# Patient Record
Sex: Female | Born: 1945 | Race: Black or African American | Hispanic: No | Marital: Married | Smoking: Never smoker
Health system: Southern US, Community
[De-identification: ages and names within clinical notes are randomized; demographics above are authoritative.]

## PROBLEM LIST (undated history)

## (undated) DIAGNOSIS — N943 Premenstrual tension syndrome: Secondary | ICD-10-CM

## (undated) DIAGNOSIS — R198 Other specified symptoms and signs involving the digestive system and abdomen: Secondary | ICD-10-CM

## (undated) DIAGNOSIS — L659 Nonscarring hair loss, unspecified: Secondary | ICD-10-CM

## (undated) DIAGNOSIS — H579 Unspecified disorder of eye and adnexa: Secondary | ICD-10-CM

## (undated) DIAGNOSIS — I10 Essential (primary) hypertension: Secondary | ICD-10-CM

## (undated) HISTORY — DX: Unspecified disorder of eye and adnexa: H57.9

## (undated) HISTORY — PX: OTHER SURGICAL HISTORY: SHX169

## (undated) HISTORY — DX: Premenstrual tension syndrome: N94.3

## (undated) HISTORY — DX: Essential (primary) hypertension: I10

## (undated) HISTORY — DX: Other specified symptoms and signs involving the digestive system and abdomen: R19.8

## (undated) HISTORY — DX: Nonscarring hair loss, unspecified: L65.9

---

## 1983-10-23 HISTORY — PX: TUBAL LIGATION: SHX77

## 1998-01-19 ENCOUNTER — Ambulatory Visit (HOSPITAL_COMMUNITY): Admission: RE | Admit: 1998-01-19 | Discharge: 1998-01-19 | Payer: Self-pay | Admitting: Family Medicine

## 1999-01-24 ENCOUNTER — Encounter: Payer: Self-pay | Admitting: Family Medicine

## 1999-01-24 ENCOUNTER — Ambulatory Visit (HOSPITAL_COMMUNITY): Admission: RE | Admit: 1999-01-24 | Discharge: 1999-01-24 | Payer: Self-pay | Admitting: Family Medicine

## 1999-03-01 ENCOUNTER — Other Ambulatory Visit: Admission: RE | Admit: 1999-03-01 | Discharge: 1999-03-01 | Payer: Self-pay | Admitting: Family Medicine

## 2000-02-13 ENCOUNTER — Ambulatory Visit (HOSPITAL_COMMUNITY): Admission: RE | Admit: 2000-02-13 | Discharge: 2000-02-13 | Payer: Self-pay | Admitting: Family Medicine

## 2000-02-13 ENCOUNTER — Encounter: Payer: Self-pay | Admitting: Family Medicine

## 2000-03-05 ENCOUNTER — Other Ambulatory Visit: Admission: RE | Admit: 2000-03-05 | Discharge: 2000-03-05 | Payer: Self-pay | Admitting: Family Medicine

## 2001-02-14 ENCOUNTER — Encounter: Payer: Self-pay | Admitting: Family Medicine

## 2001-02-14 ENCOUNTER — Ambulatory Visit (HOSPITAL_COMMUNITY): Admission: RE | Admit: 2001-02-14 | Discharge: 2001-02-14 | Payer: Self-pay | Admitting: Family Medicine

## 2001-03-06 ENCOUNTER — Other Ambulatory Visit: Admission: RE | Admit: 2001-03-06 | Discharge: 2001-03-06 | Payer: Self-pay | Admitting: Family Medicine

## 2001-05-19 ENCOUNTER — Other Ambulatory Visit: Admission: RE | Admit: 2001-05-19 | Discharge: 2001-05-19 | Payer: Self-pay | Admitting: Family Medicine

## 2002-03-04 ENCOUNTER — Ambulatory Visit (HOSPITAL_COMMUNITY): Admission: RE | Admit: 2002-03-04 | Discharge: 2002-03-04 | Payer: Self-pay | Admitting: Family Medicine

## 2002-03-04 ENCOUNTER — Encounter: Payer: Self-pay | Admitting: Family Medicine

## 2002-03-20 ENCOUNTER — Other Ambulatory Visit: Admission: RE | Admit: 2002-03-20 | Discharge: 2002-03-20 | Payer: Self-pay | Admitting: Family Medicine

## 2003-03-16 ENCOUNTER — Ambulatory Visit (HOSPITAL_COMMUNITY): Admission: RE | Admit: 2003-03-16 | Discharge: 2003-03-16 | Payer: Self-pay | Admitting: Family Medicine

## 2003-03-16 ENCOUNTER — Encounter: Payer: Self-pay | Admitting: Family Medicine

## 2003-04-27 ENCOUNTER — Other Ambulatory Visit: Admission: RE | Admit: 2003-04-27 | Discharge: 2003-04-27 | Payer: Self-pay | Admitting: Family Medicine

## 2004-03-21 ENCOUNTER — Ambulatory Visit (HOSPITAL_COMMUNITY): Admission: RE | Admit: 2004-03-21 | Discharge: 2004-03-21 | Payer: Self-pay | Admitting: Family Medicine

## 2004-05-15 ENCOUNTER — Other Ambulatory Visit: Admission: RE | Admit: 2004-05-15 | Discharge: 2004-05-15 | Payer: Self-pay | Admitting: Family Medicine

## 2004-08-28 ENCOUNTER — Ambulatory Visit: Payer: Self-pay | Admitting: Family Medicine

## 2004-08-28 ENCOUNTER — Encounter: Admission: RE | Admit: 2004-08-28 | Discharge: 2004-08-28 | Payer: Self-pay | Admitting: Family Medicine

## 2004-10-22 HISTORY — PX: BREAST ENHANCEMENT SURGERY: SHX7

## 2004-10-22 HISTORY — PX: ABDOMINAL SURGERY: SHX537

## 2004-10-22 HISTORY — PX: LIPOSUCTION: SHX10

## 2005-03-28 ENCOUNTER — Ambulatory Visit (HOSPITAL_COMMUNITY): Admission: RE | Admit: 2005-03-28 | Discharge: 2005-03-28 | Payer: Self-pay | Admitting: Family Medicine

## 2005-04-16 ENCOUNTER — Ambulatory Visit: Payer: Self-pay | Admitting: Family Medicine

## 2005-05-21 ENCOUNTER — Ambulatory Visit: Payer: Self-pay | Admitting: Family Medicine

## 2005-05-28 ENCOUNTER — Ambulatory Visit: Payer: Self-pay | Admitting: Family Medicine

## 2005-05-28 ENCOUNTER — Other Ambulatory Visit: Admission: RE | Admit: 2005-05-28 | Discharge: 2005-05-28 | Payer: Self-pay | Admitting: Family Medicine

## 2005-05-29 ENCOUNTER — Encounter: Admission: RE | Admit: 2005-05-29 | Discharge: 2005-05-29 | Payer: Self-pay | Admitting: Family Medicine

## 2005-06-06 ENCOUNTER — Ambulatory Visit: Payer: Self-pay | Admitting: Internal Medicine

## 2005-08-14 ENCOUNTER — Ambulatory Visit: Payer: Self-pay | Admitting: Family Medicine

## 2005-10-22 ENCOUNTER — Encounter: Payer: Self-pay | Admitting: Family Medicine

## 2005-10-22 HISTORY — PX: AUGMENTATION MAMMAPLASTY: SUR837

## 2006-06-03 ENCOUNTER — Ambulatory Visit: Payer: Self-pay | Admitting: Family Medicine

## 2006-06-10 ENCOUNTER — Ambulatory Visit: Payer: Self-pay | Admitting: Family Medicine

## 2006-06-10 ENCOUNTER — Other Ambulatory Visit: Admission: RE | Admit: 2006-06-10 | Discharge: 2006-06-10 | Payer: Self-pay | Admitting: Family Medicine

## 2006-06-10 ENCOUNTER — Encounter: Payer: Self-pay | Admitting: Family Medicine

## 2006-06-14 ENCOUNTER — Encounter: Admission: RE | Admit: 2006-06-14 | Discharge: 2006-06-14 | Payer: Self-pay | Admitting: Family Medicine

## 2006-08-07 ENCOUNTER — Ambulatory Visit: Payer: Self-pay | Admitting: Family Medicine

## 2007-05-29 ENCOUNTER — Encounter: Payer: Self-pay | Admitting: Family Medicine

## 2007-05-29 DIAGNOSIS — J309 Allergic rhinitis, unspecified: Secondary | ICD-10-CM

## 2007-05-29 DIAGNOSIS — R32 Unspecified urinary incontinence: Secondary | ICD-10-CM | POA: Insufficient documentation

## 2007-05-29 DIAGNOSIS — L659 Nonscarring hair loss, unspecified: Secondary | ICD-10-CM | POA: Insufficient documentation

## 2007-05-29 DIAGNOSIS — I10 Essential (primary) hypertension: Secondary | ICD-10-CM

## 2007-05-29 DIAGNOSIS — J45909 Unspecified asthma, uncomplicated: Secondary | ICD-10-CM

## 2007-06-12 ENCOUNTER — Ambulatory Visit: Payer: Self-pay | Admitting: Family Medicine

## 2007-06-12 LAB — CONVERTED CEMR LAB
AST: 18 units/L (ref 0–37)
Albumin: 4 g/dL (ref 3.5–5.2)
BUN: 14 mg/dL (ref 6–23)
Basophils Absolute: 0 10*3/uL (ref 0.0–0.1)
CO2: 36 meq/L — ABNORMAL HIGH (ref 19–32)
Cholesterol: 200 mg/dL (ref 0–200)
GFR calc Af Amer: 93 mL/min
Glucose, Urine, Semiquant: NEGATIVE
HCT: 39.3 % (ref 36.0–46.0)
HDL: 66.1 mg/dL (ref >39.0)
Monocytes Relative: 10.5 % (ref 3.0–11.0)
Potassium: 3.5 meq/L (ref 3.5–5.1)
Protein, U semiquant: NEGATIVE
TSH: 1.69 microintl units/mL (ref 0.35–5.50)
Total Protein: 7.1 g/dL (ref 6.0–8.3)
Triglycerides: 59 mg/dL (ref 0–149)
Urobilinogen, UA: 0.2
VLDL: 12 mg/dL (ref 0–40)

## 2007-07-29 ENCOUNTER — Ambulatory Visit: Payer: Self-pay | Admitting: Family Medicine

## 2007-07-29 ENCOUNTER — Encounter: Payer: Self-pay | Admitting: Family Medicine

## 2007-07-29 ENCOUNTER — Other Ambulatory Visit: Admission: RE | Admit: 2007-07-29 | Discharge: 2007-07-29 | Payer: Self-pay | Admitting: Family Medicine

## 2007-08-29 ENCOUNTER — Ambulatory Visit: Payer: Self-pay | Admitting: Internal Medicine

## 2007-09-12 ENCOUNTER — Ambulatory Visit: Payer: Self-pay | Admitting: Internal Medicine

## 2007-09-12 ENCOUNTER — Encounter: Payer: Self-pay | Admitting: Internal Medicine

## 2007-09-12 ENCOUNTER — Encounter: Payer: Self-pay | Admitting: Family Medicine

## 2007-11-26 ENCOUNTER — Encounter: Admission: RE | Admit: 2007-11-26 | Discharge: 2007-11-26 | Payer: Self-pay | Admitting: Family Medicine

## 2008-03-02 ENCOUNTER — Ambulatory Visit: Payer: Self-pay | Admitting: Family Medicine

## 2008-03-02 DIAGNOSIS — L259 Unspecified contact dermatitis, unspecified cause: Secondary | ICD-10-CM

## 2008-07-30 ENCOUNTER — Ambulatory Visit: Payer: Self-pay | Admitting: Family Medicine

## 2008-07-30 LAB — CONVERTED CEMR LAB
AST: 23 units/L (ref 0–37)
Basophils Absolute: 0 10*3/uL (ref 0.0–0.1)
Bilirubin Urine: NEGATIVE
Blood in Urine, dipstick: NEGATIVE
CO2: 34 meq/L — ABNORMAL HIGH (ref 19–32)
Calcium: 9.6 mg/dL (ref 8.4–10.5)
Chloride: 102 meq/L (ref 96–112)
Cholesterol: 215 mg/dL (ref 0–200)
Direct LDL: 139 mg/dL
Eosinophils Relative: 4.9 % (ref 0.0–5.0)
GFR calc Af Amer: 93 mL/min
GFR calc non Af Amer: 77 mL/min
Glucose, Urine, Semiquant: NEGATIVE
Hemoglobin: 12.8 g/dL (ref 12.0–15.0)
Ketones, urine, test strip: NEGATIVE
MCV: 90.5 fL (ref 78.0–100.0)
Neutro Abs: 1.7 10*3/uL (ref 1.4–7.7)
Potassium: 3.6 meq/L (ref 3.5–5.1)
Specific Gravity, Urine: 1.025
TSH: 1.67 microintl units/mL (ref 0.35–5.50)
Total Bilirubin: 0.7 mg/dL (ref 0.3–1.2)
VLDL: 8 mg/dL (ref 0–40)
pH: 5

## 2008-08-05 ENCOUNTER — Encounter: Payer: Self-pay | Admitting: Family Medicine

## 2008-08-06 ENCOUNTER — Other Ambulatory Visit: Admission: RE | Admit: 2008-08-06 | Discharge: 2008-08-06 | Payer: Self-pay | Admitting: Family Medicine

## 2008-08-06 ENCOUNTER — Encounter: Payer: Self-pay | Admitting: Family Medicine

## 2008-08-06 ENCOUNTER — Ambulatory Visit: Payer: Self-pay | Admitting: Family Medicine

## 2008-08-16 ENCOUNTER — Encounter: Admission: RE | Admit: 2008-08-16 | Discharge: 2008-08-16 | Payer: Self-pay | Admitting: Family Medicine

## 2008-08-19 ENCOUNTER — Ambulatory Visit: Payer: Self-pay | Admitting: Family Medicine

## 2009-02-05 ENCOUNTER — Ambulatory Visit: Payer: Self-pay | Admitting: Family Medicine

## 2009-03-14 ENCOUNTER — Ambulatory Visit: Payer: Self-pay | Admitting: Family Medicine

## 2009-03-15 DIAGNOSIS — I872 Venous insufficiency (chronic) (peripheral): Secondary | ICD-10-CM | POA: Insufficient documentation

## 2009-04-12 ENCOUNTER — Encounter: Admission: RE | Admit: 2009-04-12 | Discharge: 2009-04-12 | Payer: Self-pay | Admitting: Family Medicine

## 2009-08-08 ENCOUNTER — Ambulatory Visit: Payer: Self-pay | Admitting: Family Medicine

## 2009-08-08 ENCOUNTER — Encounter: Payer: Self-pay | Admitting: Family Medicine

## 2009-08-08 ENCOUNTER — Other Ambulatory Visit: Admission: RE | Admit: 2009-08-08 | Discharge: 2009-08-08 | Payer: Self-pay | Admitting: Family Medicine

## 2009-08-08 LAB — CONVERTED CEMR LAB
Bilirubin Urine: NEGATIVE
Glucose, Urine, Semiquant: NEGATIVE
Specific Gravity, Urine: 1.02
Urobilinogen, UA: 0.2

## 2009-08-09 LAB — CONVERTED CEMR LAB
ALT: 28 units/L (ref 0–35)
Albumin: 4.1 g/dL (ref 3.5–5.2)
BUN: 17 mg/dL (ref 6–23)
CO2: 34 meq/L — ABNORMAL HIGH (ref 19–32)
Calcium: 9.3 mg/dL (ref 8.4–10.5)
Direct LDL: 174.2 mg/dL
Eosinophils Relative: 4.3 % (ref 0.0–5.0)
GFR calc non Af Amer: 80.72 mL/min (ref >60)
Glucose, Bld: 97 mg/dL (ref 70–99)
HCT: 40.5 % (ref 36.0–46.0)
Hemoglobin: 13.3 g/dL (ref 12.0–15.0)
MCV: 92.1 fL (ref 78.0–100.0)
Monocytes Absolute: 0.4 10*3/uL (ref 0.1–1.0)
Monocytes Relative: 12.3 % — ABNORMAL HIGH (ref 3.0–12.0)
Neutro Abs: 1.6 10*3/uL (ref 1.4–7.7)
Neutrophils Relative %: 44.8 % (ref 43.0–77.0)
TSH: 2.08 microintl units/mL (ref 0.35–5.50)
Total Protein: 7.9 g/dL (ref 6.0–8.3)
WBC: 3.6 10*3/uL — ABNORMAL LOW (ref 4.5–10.5)

## 2009-10-15 IMAGING — MG MM SCREENING W/IMPLANTS
9 series · 9 of 9 positions shown · non-contrast
Comparison: none

DG SCREENING W/IMPLANTS
Bilateral CC and MLO view(s) were taken.

DIGITAL SCREENING MAMMOGRAM W/IMPLANTS WITH CAD:
Implants are present in a subpectoral location.  Standard and modified compression views are 
obtained.
There are scattered fibroglandular densities.  No dominant masses or malignant type calcifications 
are identified.  Compared with prior studies.

[R CC]
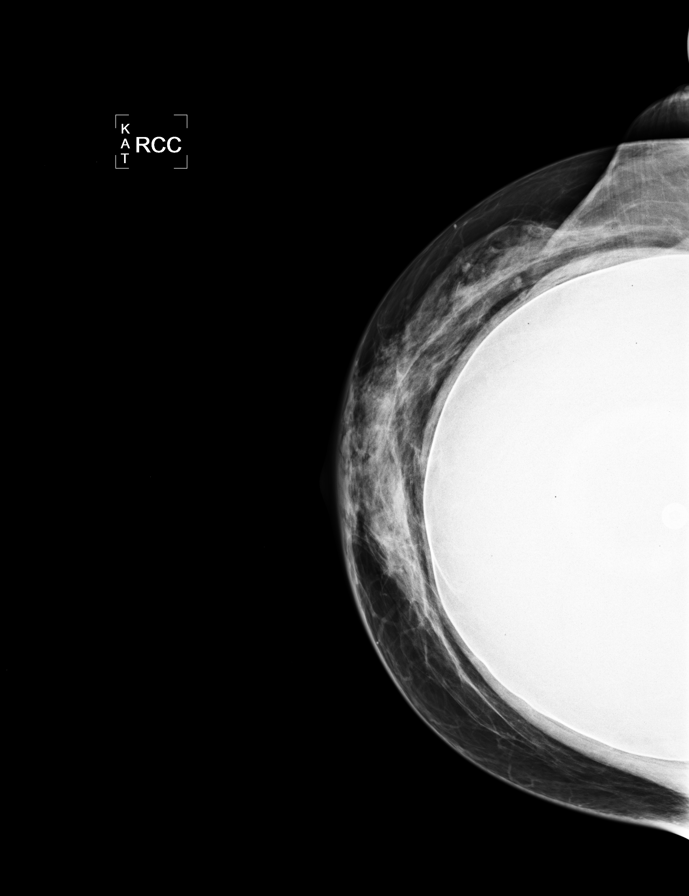

[L CC]
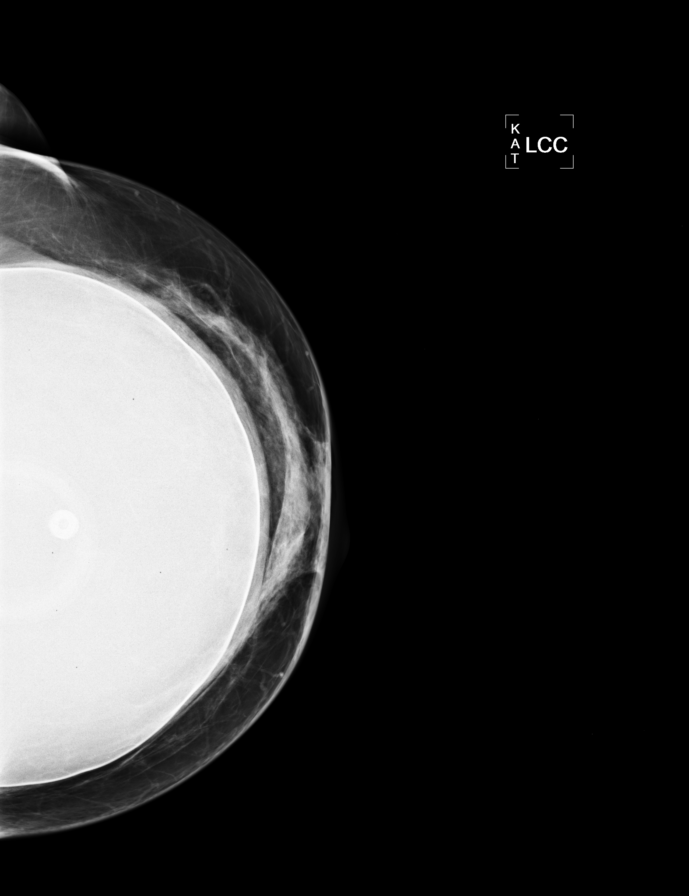

[L MLO]
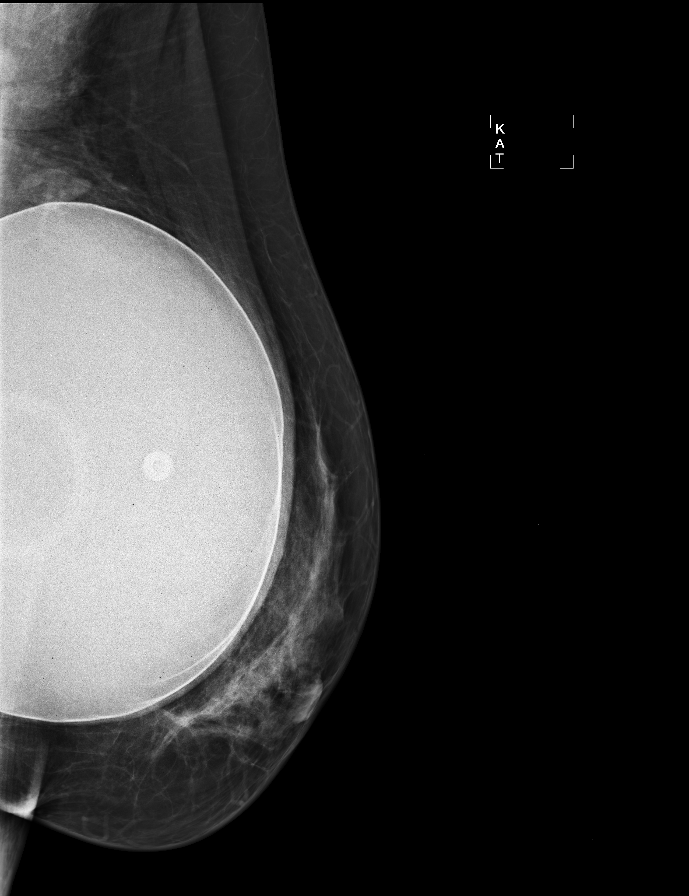

[R MLO]
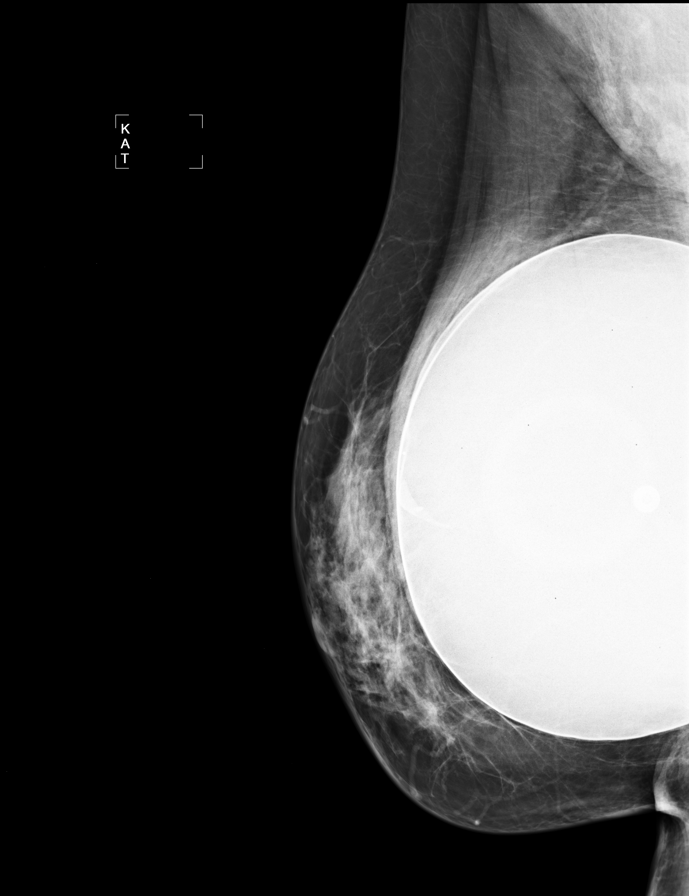

[R CCID (1 of 2)]
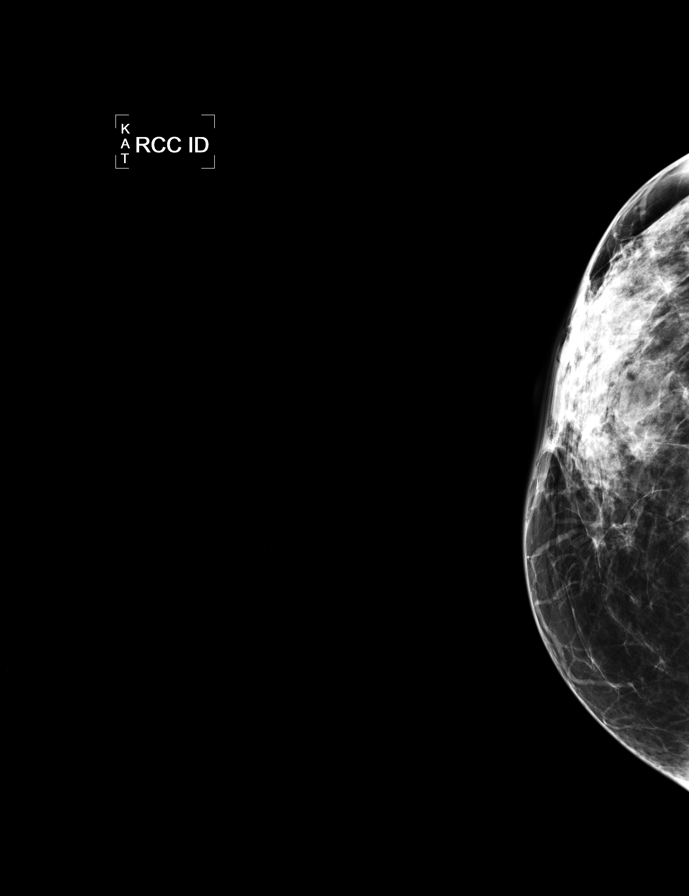

[L CCID]
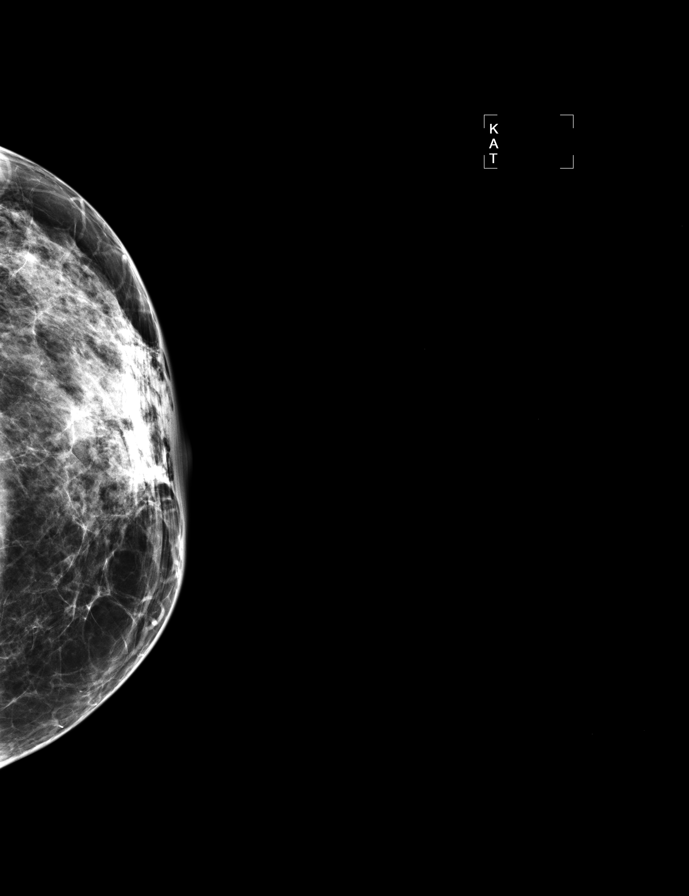

[L MLOID]
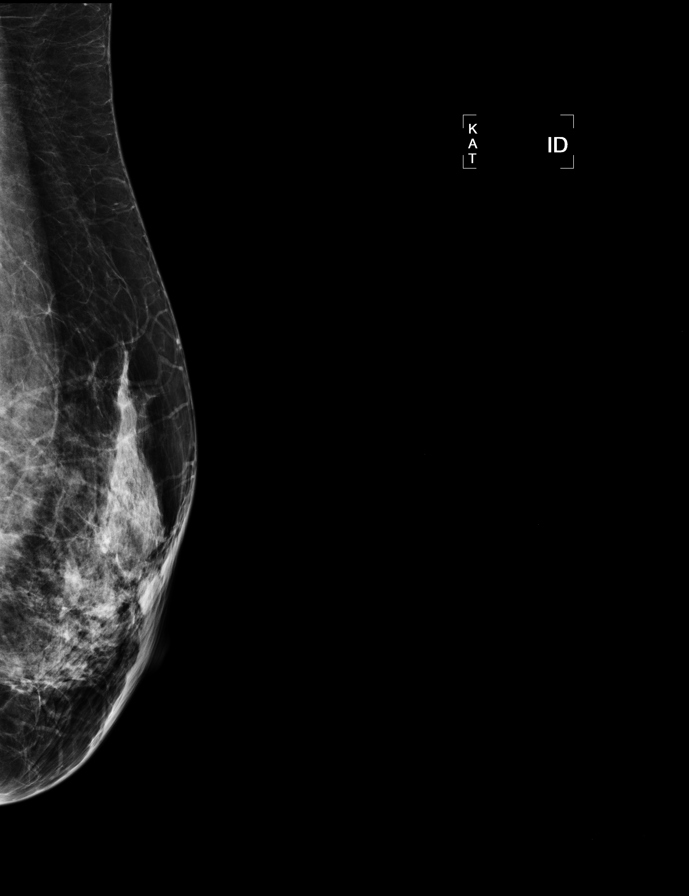

[R MLOID]
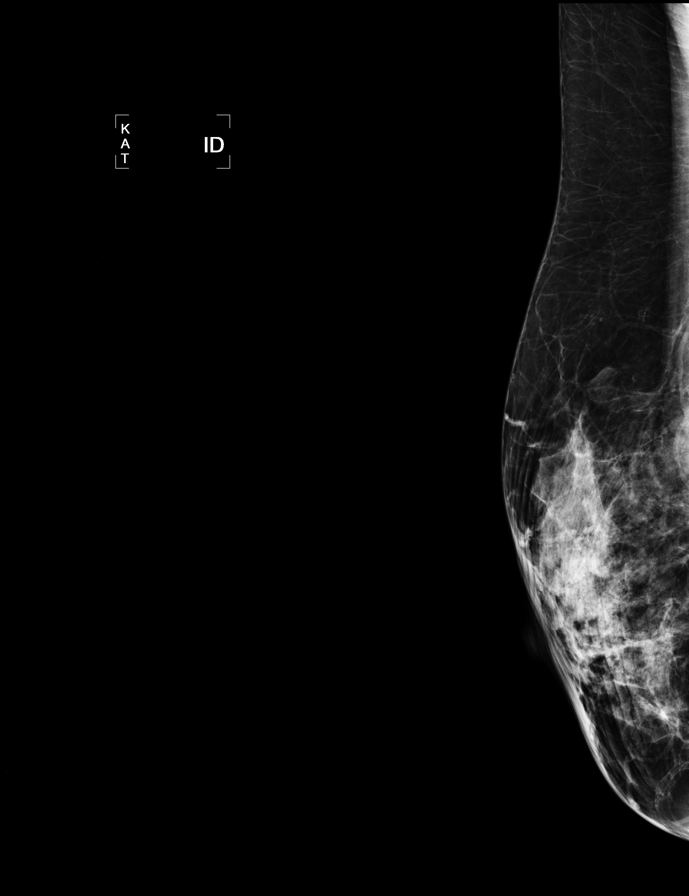

[R CCID (2 of 2)]
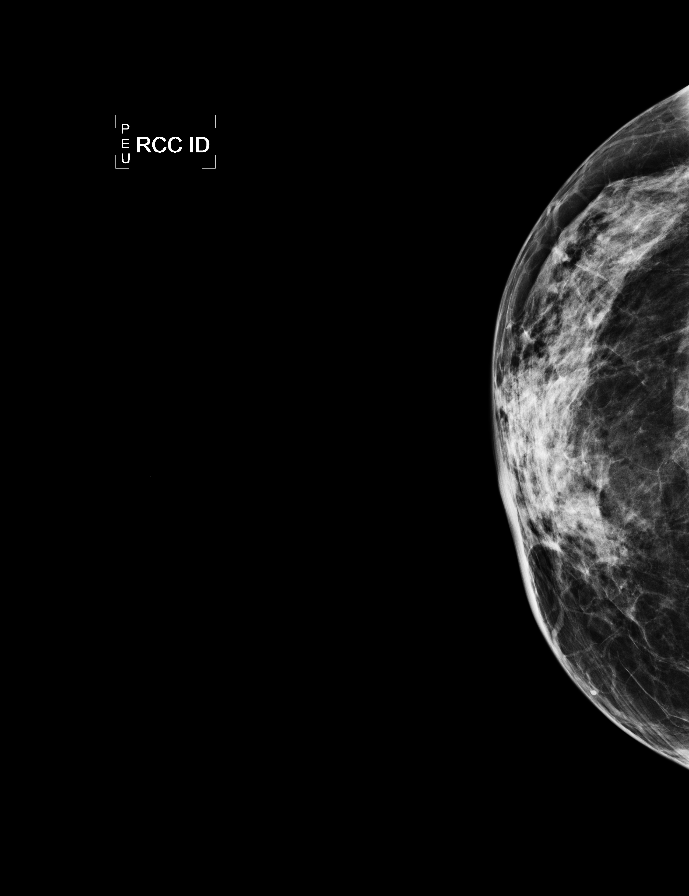

[9 of 9 positions shown; findings below may reference images not displayed]

IMPRESSION: No specific mammographic evidence of malignancy.  Next screening mammogram is recommended in one 
year.

ASSESSMENT: Negative - BI-RADS 1

Screening mammogram in 1 year.
ANALYZED BY COMPUTER AIDED DETECTION. , THIS PROCEDURE WAS A DIGITAL MAMMOGRAM.

## 2010-03-06 ENCOUNTER — Telehealth: Payer: Self-pay | Admitting: Family Medicine

## 2010-05-26 ENCOUNTER — Encounter: Admission: RE | Admit: 2010-05-26 | Discharge: 2010-05-26 | Payer: Self-pay | Admitting: Family Medicine

## 2010-05-26 LAB — HM MAMMOGRAPHY: HM Mammogram: NEGATIVE

## 2010-08-11 ENCOUNTER — Other Ambulatory Visit: Admission: RE | Admit: 2010-08-11 | Discharge: 2010-08-11 | Payer: Self-pay | Admitting: Family Medicine

## 2010-08-11 ENCOUNTER — Ambulatory Visit: Payer: Self-pay | Admitting: Family Medicine

## 2010-08-11 ENCOUNTER — Encounter: Payer: Self-pay | Admitting: Family Medicine

## 2010-08-11 LAB — CONVERTED CEMR LAB
Albumin: 4 g/dL (ref 3.5–5.2)
Alkaline Phosphatase: 73 units/L (ref 39–117)
Basophils Absolute: 0 10*3/uL (ref 0.0–0.1)
Bilirubin, Direct: 0.1 mg/dL (ref 0.0–0.3)
Calcium: 9.5 mg/dL (ref 8.4–10.5)
Cholesterol: 206 mg/dL — ABNORMAL HIGH (ref 0–200)
Eosinophils Relative: 5.6 % — ABNORMAL HIGH (ref 0.0–5.0)
GFR calc non Af Amer: 96.34 mL/min (ref >60)
Lymphocytes Relative: 35.2 % (ref 12.0–46.0)
MCHC: 33.5 g/dL (ref 30.0–36.0)
Monocytes Absolute: 0.4 10*3/uL (ref 0.1–1.0)
Monocytes Relative: 9.8 % (ref 3.0–12.0)
RBC: 4.2 M/uL (ref 3.87–5.11)
Sodium: 143 meq/L (ref 135–145)
TSH: 1.63 microintl units/mL (ref 0.35–5.50)
Total CHOL/HDL Ratio: 3
Total Protein: 7 g/dL (ref 6.0–8.3)
Triglycerides: 43 mg/dL (ref 0.0–149.0)
VLDL: 8.6 mg/dL (ref 0.0–40.0)
WBC: 4.4 10*3/uL — ABNORMAL LOW (ref 4.5–10.5)

## 2010-08-11 LAB — HM PAP SMEAR

## 2010-11-21 NOTE — Progress Notes (Signed)
Summary: maxzide change  Phone Note From Pharmacy   Summary of Call: maxzide 25 is on long-term back order.  which medication would you like to change this to? Initial call taken by: Kern Reap CMA Duncan Dull),  Mar 06, 2010 2:43 PM  Follow-up for Phone Call        ask the  pharmacist for another product in the same category Follow-up by: Roderick Pee MD,  Mar 06, 2010 3:23 PM  Additional Follow-up for Phone Call Additional follow up Details #1::        spoke with phamacy.  they now have maxzide so rx stays the same Additional Follow-up by: Kern Reap CMA Duncan Dull),  Mar 07, 2010 12:45 PM

## 2010-11-21 NOTE — Assessment & Plan Note (Signed)
Summary: pt will come in fasting/njr   Vital Signs:  Patient profile:   65 year old female Menstrual status:  postmenopausal Height:      64.25 inches Weight:      152 pounds BMI:     25.98 Temp:     98.4 degrees F oral BP sitting:   120 / 80  (left arm) Cuff size:   regular  Vitals Entered By: Kern Reap CMA Duncan Dull) (August 11, 2010 9:45 AM) CC: annual wellness exam Is Patient Diabetic? No Pain Assessment Patient in pain? no        CC:  annual wellness exam.  History of Present Illness: [name] is a 65 year old, married female, nonsmoker retired, who comes in today for Medicare wellness examination because of a history of underlying sleep dysfunction.  Venous insufficiency, postmenopausal vaginal dryness, allergic rhinitis and eczema.  Her medication list reviewed and is unchanged.  The only exception is that he change to Maxzide from 25 mg to 50 when she takes a half a tablet day.  She gets routine eye care, dental care, BSE monthly, annual mammography, colonoscopy, and GI, tetanus, 2009, Pneumovax 2010, seasonal flu shot today.Marland Kitchen  Here for Medicare AWV:  1.   Risk factors based on Past M, S, F history:we did no changes in joint retirement 2.   Physical Activities: walks daily 3.   Depression/mood: good mood.  No depression 4.   Hearing: normal 5.   ADL's: functions normally and independently 6.   Fall Risk: reviewed none 7.   Home Safety: reviewed not identified 8.   Height, weight, &visual acuity:height weight, normal.  Vision normal 9.   Counseling: continue good health habits 10.   Labs ordered based on risk factors: done today 11.           Referral Coordination..none indicated 12.           Care Plan...Marland KitchenMarland KitchenMarland Kitchenreviewed all medications 13.            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 Pcn  Past History:  Past medical, surgical, family and social histories (including risk factors)  reviewed, and no changes noted (except as noted below).  Past Medical History: Reviewed history from 07/29/2007 and no changes required. Allergic rhinitis Asthma Hypertension Urinary incontinence  2005 PMS allergic eye hair loss bsts. implants/tummy tuck 06  Past Surgical History: Reviewed history from 08/08/2009 and no changes required. CB X3 Tubal ligation, bilateral implants (saline)  2006 liposuction abdominal  Family History: Reviewed history from 03/02/2008 and no changes required. Family History of Alcoholism/Addiction Family History Diabetes 1st degree relative Family History Hypertension Family History of Prostate CA 1st degree relative <50  Social History: Reviewed history from 03/02/2008 and no changes required. Occupation: Married Never Smoked Alcohol use-no Drug use-no Regular exercise-yes  Review of Systems      See HPI       Flu Vaccine Consent Questions     Do you have a history of severe allergic reactions to this vaccine? no    Any prior history of allergic reactions to egg and/or gelatin? no    Do you have a sensitivity to the preservative Thimersol? no    Do you have a past history of Guillan-Barre Syndrome? no    Do you currently have an acute febrile illness? no    Have you ever had a severe reaction to latex? no    Vaccine information given and  explained to patient? yes    Are you currently pregnant? no    Lot Number:[id number]   Exp Date:04/21/2011   Site Given  Left Deltoid IM Flu Vaccine Consent Questions     Do you have a history of severe allergic reactions to this vaccine? no    Any prior history of allergic reactions to egg and/or gelatin? no    Do you have a sensitivity to the preservative Thimersol? no    Do you have a past history of Guillan-Barre Syndrome? no    Do you currently have an acute febrile illness? no    Have you ever had a severe reaction to latex? no    Vaccine information given and explained to patient? yes     Are you currently pregnant? no    Lot Number:[id number]   Exp Date:04/21/2011   Site Given  Left Deltoid IM   Physical Exam  General:  Well-developed,well-nourished,in no acute distress; alert,appropriate and cooperative throughout examination Head:  Normocephalic and atraumatic without obvious abnormalities. No apparent alopecia or balding. Eyes:  No corneal or conjunctival inflammation noted. EOMI. Perrla. Funduscopic exam benign, without hemorrhages, exudates or papilledema. Vision grossly normal. Ears:  External ear exam shows no significant lesions or deformities.  Otoscopic examination reveals clear canals, tympanic membranes are intact bilaterally without bulging, retraction, inflammation or discharge. Hearing is grossly normal bilaterally. Nose:  External nasal examination shows no deformity or inflammation. Nasal mucosa are pink and moist without lesions or exudates. Mouth:  Oral mucosa and oropharynx without lesions or exudates.  Teeth in good repair. Neck:  No deformities, masses, or tenderness noted. Chest Wall:  No deformities, masses, or tenderness noted. Breasts:  No mass, nodules, thickening, tenderness, bulging, retraction, inflamation, nipple discharge or skin changes noted.  .......Marland Kitchenbilateral implants Lungs:  Normal respiratory effort, chest expands symmetrically. Lungs are clear to auscultation, no crackles or wheezes. Heart:  Normal rate and regular rhythm. S1 and S2 normal without gallop, murmur, click, rub or other extra sounds. Abdomen:  Bowel sounds positive,abdomen soft and non-tender without masses, organomegaly or hernias noted. Rectal:  No external abnormalities noted. Normal sphincter tone. No rectal masses or tenderness. Genitalia:  Pelvic Exam:        External: normal female genitalia without lesions or masses        Vagina: normal without lesions or masses        Cervix: normal without lesions or masses        Adnexa: normal bimanual exam without masses or  fullness        Uterus: normal by palpation        Pap smear: performed Msk:  No deformity or scoliosis noted of thoracic or lumbar spine.   Pulses:  R and L carotid,radial,femoral,dorsalis pedis and posterior tibial pulses are full and equal bilaterally Extremities:  No clubbing, cyanosis, edema, or deformity noted with normal full range of motion of all joints.   Neurologic:  No cranial nerve deficits noted. Station and gait are normal. Plantar reflexes are down-going bilaterally. DTRs are symmetrical throughout. Sensory, motor and coordinative functions appear intact. Skin:  total body skin exam normal except for scars in the abdomen from previous abdominoplasty Cervical Nodes:  No lymphadenopathy noted Axillary Nodes:  No palpable lymphadenopathy Inguinal Nodes:  No significant adenopathy Psych:  Cognition and judgment appear intact. Alert and cooperative with normal attention span and concentration. No apparent delusions, illusions, hallucinations   Impression & Recommendations:  Problem # 1:  VENOUS INSUFFICIENCY, LEGS (ICD-459.81)  Assessment Improved  Orders: Prescription Created Electronically [phone]) Medicare -1st Annual Wellness Visit [phone]) Urinalysis-dipstick only (Medicare patient) [phone]) Venipuncture (47425) TLB-Lipid Panel (80061-LIPID) TLB-BMP (Basic Metabolic Panel-BMET) (80048-METABOL) TLB-CBC Platelet - w/Differential (85025-CBCD) TLB-Hepatic/Liver Function Pnl (80076-HEPATIC) TLB-TSH (Thyroid Stimulating Hormone) (84443-TSH) Specimen Handling (95638)  Problem # 2:  HYPERTENSION (ICD-401.9) Assessment: Improved  The following medications were removed from the medication list:    Maxzide-25 37.5-25 Mg Tabs (Triamterene-hctz) .Marland Kitchen... 1 once daily Her updated medication list for this problem includes:    Maxzide 75-50 Mg Tabs (Triamterene-hctz) .Marland Kitchen... 1/2 qam  Orders: Prescription Created Electronically [phone]) Medicare -1st Annual Wellness Visit  [phone]) Urinalysis-dipstick only (Medicare patient) [phone]) Venipuncture (63016) TLB-Lipid Panel (80061-LIPID) TLB-BMP (Basic Metabolic Panel-BMET) (80048-METABOL) TLB-CBC Platelet - w/Differential (85025-CBCD) TLB-Hepatic/Liver Function Pnl (80076-HEPATIC) TLB-TSH (Thyroid Stimulating Hormone) (84443-TSH) EKG w/ Interpretation (93000) Specimen Handling (01093)  Problem # 3:  PMS (ICD-625.4) Assessment: Improved  Orders: Prescription Created Electronically [phone]) Medicare -1st Annual Wellness Visit [phone]) Urinalysis-dipstick only (Medicare patient) ([phone]) Venipuncture (37628) TLB-Lipid Panel (80061-LIPID) TLB-BMP (Basic Metabolic Panel-BMET) (80048-METABOL) TLB-CBC Platelet - w/Differential (85025-CBCD) TLB-Hepatic/Liver Function Pnl (80076-HEPATIC) TLB-TSH (Thyroid Stimulating Hormone) (84443-TSH) Specimen Handling (31517)  Problem # 4:  ALLERGIC RHINITIS (ICD-477.9) Assessment: Improved  Her updated medication list for this problem includes:    Zyrtec Allergy 10 Mg Tabs (Cetirizine hcl) .Marland Kitchen... As needed    Flonase 50 Mcg/act Susp (Fluticasone propionate) .Marland Kitchen... As needed  Orders: Prescription Created Electronically [phone]) Medicare -1st Annual Wellness Visit [phone]) Urinalysis-dipstick only (Medicare patient) [phone]) Venipuncture (62703) TLB-Lipid Panel (80061-LIPID) TLB-BMP (Basic Metabolic Panel-BMET) (80048-METABOL) TLB-CBC Platelet - w/Differential (85025-CBCD) TLB-Hepatic/Liver Function Pnl (80076-HEPATIC) TLB-TSH (Thyroid Stimulating Hormone) (84443-TSH)  Problem # 5:  Preventive Health Care (ICD-V70.0) Assessment: Unchanged  Complete Medication List: 1)  Patanol 0.1 % Soln (Olopatadine hcl) .... As needed 2)  Zyrtec Allergy 10 Mg Tabs (Cetirizine hcl) .... As needed 3)  Lidex 0.05 % Crea (Fluocinonide) .... As needed 4)  Premarin 0.625 Mg/gm Crea (Estrogens, conjugated) .... Weekly 5)  Flonase 50 Mcg/act Susp (Fluticasone propionate) .... As  needed 6)  Pamelor 10 Mg Caps (Nortriptyline hcl) .... At bedtime as needed 7)  Calcium  .... Once daily 8)  Maxzide 75-50 Mg Tabs (Triamterene-hctz) .... 1/2 qam  Other Orders: Flu Vaccine 87yrs + MEDICARE PATIENTS (J0093) Administration Flu vaccine - MCR (G1829)  Patient Instructions: 1)  continue current medications except take a half of a 50 mg, Maxide daily. 2)  Please schedule a follow-up appointment in 1 year. 3)  It is important that you exercise regularly at least 20 minutes 5 times a week. If you develop chest pain, have severe difficulty breathing, or feel very tired , stop exercising immediately and seek medical attention. 4)  Schedule your mammogram. 5)  Schedule a colonoscopy/sigmoidoscopy to help detect colon cancer. 6)  Take calcium +Vitamin D daily. 7)  Take an Aspirin every day. Prescriptions: PAMELOR 10 MG  CAPS (NORTRIPTYLINE HCL) at bedtime as needed  #100 x 3   Entered and Authorized by:   Roderick Pee MD   Signed by:   Roderick Pee MD on 08/11/2010   Method used:   Electronically to        [facility] (retail)       [address]       [place], [place]  [address]       Ph: [phone]       Fax: [phone]  RxID:   [id number] FLONASE 50 MCG/ACT  SUSP (FLUTICASONE PROPIONATE) as needed  #3 x 4   Entered and Authorized by:   Roderick Pee MD   Signed by:   Roderick Pee MD on 08/11/2010   Method used:   Electronically to        [facility] (retail)       [address]       [place], [place]  [address]       Ph: [phone]       Fax: [phone]   RxID:   [id number] PREMARIN 0.625 MG/GM  CREA (ESTROGENS, CONJUGATED) weekly  #3 tubes x 4   Entered and Authorized by:   Roderick Pee MD   Signed by:   Roderick Pee MD on 08/11/2010   Method used:   Electronically to        [facility] (retail)       [address]       [place], [place]   [address]       Ph: [phone]       Fax: [phone]   RxID:   [id number] LIDEX 0.05 %  CREA (FLUOCINONIDE) as needed  #60 grms x 4   Entered and Authorized by:   Roderick Pee MD   Signed by:   Roderick Pee MD on 08/11/2010   Method used:   Electronically to        [facility] (retail)       [address]       [place], [place]  [address]       Ph: [phone]       Fax: [phone]   RxID:   [id number] PATANOL 0.1 %  SOLN (OLOPATADINE HCL) as needed  #3 x 4   Entered and Authorized by:   Roderick Pee MD   Signed by:   Roderick Pee MD on 08/11/2010   Method used:   Electronically to        [facility] (retail)       [address]       [place], [place]  [address]       Ph: [phone]       Fax: [phone]   RxID:   [id number] MAXZIDE 75-50 MG TABS (TRIAMTERENE-HCTZ) 1/2 qam  #50 x 3   Entered and Authorized by:   Roderick Pee MD   Signed by:   Roderick Pee MD on 08/11/2010   Method used:   Electronically to        [facility] (retail)       [address]       [place], [place]  [address]       Ph: [phone]       Fax: [phone]   RxID:   [id number]    Orders Added: 1)  Prescription Created Electronically [phone] 2)  Medicare -1st Annual Wellness Visit [G0438] 3)  Urinalysis-dipstick only (Medicare patient) [[id number]] 4)  Venipuncture [36415] 5)  TLB-Lipid Panel [80061-LIPID] 6)  TLB-BMP (Basic Metabolic Panel-BMET) [80048-METABOL] 7)  TLB-CBC Platelet - w/Differential [85025-CBCD] 8)  TLB-Hepatic/Liver Function Pnl [80076-HEPATIC] 9)  TLB-TSH (Thyroid Stimulating Hormone) [75916-BWG]  10)  EKG w/ Interpretation [93000] 11)  Flu Vaccine 69yrs + MEDICARE PATIENTS [Q2039] 12)  Administration Flu vaccine - MCR [G0008] 13)  Specimen Handling [99000]  Appended Document: Orders Update    Clinical Lists Changes  Observations: Added new  observation of COMMENTS: Wynona Canes, CMA  August 11, 2010 3:06 PM  (08/11/2010 15:06) Added new observation of PH URINE: 5.5  (08/11/2010 15:06) Added new observation of SPEC GR URIN: 1.025  (08/11/2010 15:06) Added new observation of APPEARANCE U: Clear  (08/11/2010 15:06) Added new observation of UA COLOR: yellow  (08/11/2010 15:06) Added new observation of WBC DIPSTK U: trace  (08/11/2010 15:06) Added new observation of NITRITE URN: negative  (08/11/2010 15:06) Added new observation of UROBILINOGEN: 0.2  (08/11/2010 15:06) Added new observation of PROTEIN, URN: negative  (08/11/2010 15:06) Added new observation of BLOOD UR DIP: negative  (08/11/2010 15:06) Added new observation of KETONES URN: trace (5)  (08/11/2010 15:06) Added new observation of BILIRUBIN UR: negative  (08/11/2010 15:06) Added new observation of GLUCOSE, URN: negative  (08/11/2010 15:06)      Laboratory Results   Urine Tests  Date/Time Recieved: August 11, 2010 3:06 PM  Date/Time Reported: August 11, 2010 3:06 PM   Routine Urinalysis   Color: yellow Appearance: Clear Glucose: negative   (Normal Range: Negative) Bilirubin: negative   (Normal Range: Negative) Ketone: trace (5)   (Normal Range: Negative) Spec. Gravity: 1.025   (Normal Range: 1.003-1.035) Blood: negative   (Normal Range: Negative) pH: 5.5   (Normal Range: 5.0-8.0) Protein: negative   (Normal Range: Negative) Urobilinogen: 0.2   (Normal Range: 0-1) Nitrite: negative   (Normal Range: Negative) Leukocyte Esterace: trace   (Normal Range: Negative)    Comments: Wynona Canes, CMA  August 11, 2010 3:06 PM

## 2011-07-02 ENCOUNTER — Other Ambulatory Visit: Payer: Self-pay | Admitting: Family Medicine

## 2011-07-02 DIAGNOSIS — Z1231 Encounter for screening mammogram for malignant neoplasm of breast: Secondary | ICD-10-CM

## 2011-07-11 ENCOUNTER — Ambulatory Visit
Admission: RE | Admit: 2011-07-11 | Discharge: 2011-07-11 | Disposition: A | Discharge status: Home / Self Care | Payer: 59 | Source: Ambulatory Visit | Attending: Family Medicine | Admitting: Family Medicine

## 2011-07-11 DIAGNOSIS — Z1231 Encounter for screening mammogram for malignant neoplasm of breast: Secondary | ICD-10-CM

## 2011-08-13 ENCOUNTER — Encounter: Payer: Self-pay | Admitting: Family Medicine

## 2011-08-14 ENCOUNTER — Other Ambulatory Visit (HOSPITAL_COMMUNITY)
Admission: RE | Admit: 2011-08-14 | Discharge: 2011-08-14 | Disposition: A | Discharge status: Home / Self Care | Payer: Medicare Other | Source: Ambulatory Visit | Attending: Family Medicine | Admitting: Family Medicine

## 2011-08-14 ENCOUNTER — Ambulatory Visit (INDEPENDENT_AMBULATORY_CARE_PROVIDER_SITE_OTHER): Payer: 59 | Admitting: Family Medicine

## 2011-08-14 ENCOUNTER — Encounter: Payer: Self-pay | Admitting: Family Medicine

## 2011-08-14 DIAGNOSIS — N952 Postmenopausal atrophic vaginitis: Secondary | ICD-10-CM | POA: Insufficient documentation

## 2011-08-14 DIAGNOSIS — I872 Venous insufficiency (chronic) (peripheral): Secondary | ICD-10-CM

## 2011-08-14 DIAGNOSIS — Z01419 Encounter for gynecological examination (general) (routine) without abnormal findings: Secondary | ICD-10-CM | POA: Insufficient documentation

## 2011-08-14 DIAGNOSIS — I10 Essential (primary) hypertension: Secondary | ICD-10-CM

## 2011-08-14 DIAGNOSIS — Z0000 Encounter for general adult medical examination without abnormal findings: Secondary | ICD-10-CM

## 2011-08-14 DIAGNOSIS — Z23 Encounter for immunization: Secondary | ICD-10-CM

## 2011-08-14 LAB — POCT URINALYSIS DIPSTICK
Bilirubin, UA: NEGATIVE
Blood, UA: NEGATIVE
Ketones, UA: NEGATIVE
Protein, UA: NEGATIVE
Spec Grav, UA: 1.025
pH, UA: 5.5

## 2011-08-14 LAB — CBC WITH DIFFERENTIAL/PLATELET
Basophils Absolute: 0 10*3/uL (ref 0.0–0.1)
Basophils Relative: 0.6 % (ref 0.0–3.0)
Eosinophils Absolute: 0.3 10*3/uL (ref 0.0–0.7)
HCT: 40.5 % (ref 36.0–46.0)
Hemoglobin: 13.3 g/dL (ref 12.0–15.0)
Lymphocytes Relative: 40.9 % (ref 12.0–46.0)
Lymphs Abs: 1.8 10*3/uL (ref 0.7–4.0)
MCHC: 32.8 g/dL (ref 30.0–36.0)
MCV: 92.9 fl (ref 78.0–100.0)
Monocytes Absolute: 0.5 10*3/uL (ref 0.1–1.0)
Neutro Abs: 1.8 10*3/uL (ref 1.4–7.7)
RBC: 4.36 Mil/uL (ref 3.87–5.11)
RDW: 13.6 % (ref 11.5–14.6)

## 2011-08-14 LAB — HEPATIC FUNCTION PANEL
Alkaline Phosphatase: 70 U/L (ref 39–117)
Bilirubin, Direct: 0 mg/dL (ref 0.0–0.3)
Total Bilirubin: 0.7 mg/dL (ref 0.3–1.2)
Total Protein: 7.7 g/dL (ref 6.0–8.3)

## 2011-08-14 LAB — BASIC METABOLIC PANEL
BUN: 18 mg/dL (ref 6–23)
CO2: 33 mEq/L — ABNORMAL HIGH (ref 19–32)
Chloride: 101 mEq/L (ref 96–112)
Creatinine, Ser: 0.9 mg/dL (ref 0.4–1.2)
Potassium: 4.6 mEq/L (ref 3.5–5.1)

## 2011-08-14 LAB — LIPID PANEL
HDL: 73.1 mg/dL (ref >39.00)
VLDL: 8.4 mg/dL (ref 0.0–40.0)

## 2011-08-14 LAB — LDL CHOLESTEROL, DIRECT: Direct LDL: 134.5 mg/dL

## 2011-08-14 MED ORDER — NORTRIPTYLINE HCL 25 MG PO CAPS: 25.0000 mg | ORAL_CAPSULE | Freq: Every day | ORAL | Status: DC

## 2011-08-14 MED ORDER — OLOPATADINE HCL 0.1 % OP SOLN: 1.0000 [drp] | Freq: Two times a day (BID) | OPHTHALMIC | Status: DC | PRN

## 2011-08-14 MED ORDER — FLUOCINONIDE 0.05 % EX CREA: 1.0000 "application " | TOPICAL_CREAM | Freq: Two times a day (BID) | CUTANEOUS | Status: DC | PRN

## 2011-08-14 MED ORDER — FLUTICASONE PROPIONATE 50 MCG/ACT NA SUSP: 2.0000 | Freq: Every day | NASAL | Status: DC | PRN

## 2011-08-14 MED ORDER — ESTROGENS, CONJUGATED 0.625 MG/GM VA CREA: TOPICAL_CREAM | VAGINAL | Status: DC

## 2011-08-14 MED ORDER — TRIAMTERENE-HCTZ 75-50 MG PO TABS: 1.0000 | ORAL_TABLET | Freq: Every day | ORAL | Status: DC

## 2011-08-14 NOTE — Progress Notes (Signed)
  Subjective:    Patient ID: Wendy Salazar, female    DOB: 07-Dec-1945, 65 y.o.   MRN: [id number]  HPI Wendy Salazar is a delightful, 65 year old, married female, nonsmoker, who comes in today for Medicare wellness exam.  Because of a history of allergic rhinitis, postmenopausal vaginal dryness, eczema, sleep dysfunction, mild hypertension, and fluid retention,  Her medications were reviewed.  There been no changes.  She gets routine eye care, hearing normal, regular dental care, TSE monthly, and a new mammography......... Bilateral breast implants..... Colonoscopy normal, Pneumovax 2010, tetanus, 2009, seasonal flu shot today.  Information given on shingles.  Her cognitive function is normal.  She walks on a daily basis.  She is retired.  Home health safety reviewed.  No issues identified, no guns in the house.  She does have a healthcare power of attorney, and a living will.   Review of Systems  Constitutional: Negative.   HENT: Negative.   Eyes: Negative.   Respiratory: Negative.   Cardiovascular: Negative.   Gastrointestinal: Negative.   Genitourinary: Negative.   Musculoskeletal: Negative.   Neurological: Negative.   Hematological: Negative.   Psychiatric/Behavioral: Negative.        Objective:   Physical Exam  Constitutional: She appears well-developed and well-nourished.  HENT:  Head: Normocephalic and atraumatic.  Right Ear: External ear normal.  Left Ear: External ear normal.  Nose: Nose normal.  Mouth/Throat: Oropharynx is clear and moist.  Eyes: EOM are normal. Pupils are equal, round, and reactive to light.  Neck: Normal range of motion. Neck supple. No thyromegaly present.  Cardiovascular: Normal rate, regular rhythm, normal heart sounds and intact distal pulses.  Exam reveals no gallop and no friction rub.   No murmur heard. Pulmonary/Chest: Effort normal and breath sounds normal.  Abdominal: Soft. Bowel sounds are normal. She exhibits no distension and no mass.  There is no tenderness. There is no rebound.  Genitourinary: Vagina normal and uterus normal. Guaiac negative stool. No vaginal discharge found.       Bilateral breast exam shows bilateral implants.  No palpable massesPap smear is done.  She has a small endocervical polyp  Musculoskeletal: Normal range of motion.  Lymphadenopathy:    She has no cervical adenopathy.  Neurological: She is alert. She has normal reflexes. No cranial nerve deficit. She exhibits normal muscle tone. Coordination normal.  Skin: Skin is warm and dry.       Total body skin exam normal.  Scar in the midline from previous.  A light post suction  Psychiatric: She has a normal mood and affect. Her behavior is normal. Judgment and thought content normal.          Assessment & Plan:  Healthy female.  Allergic rhinitis.  Continue current medications  Post menopausal.  Continue exercise, calcium, vitamin D, and Premarin vaginal cream.  A similar Lidex p.r.n.  Sleep dysfunction.  Increase Pamelor to 25 mg nightly  Fluid retention, mild hypertension.  Continue Maxzide one half tab daily.  Return in one year, sooner if any problems consider the shingles.  Vaccination

## 2011-08-14 NOTE — Patient Instructions (Signed)
Continue your good health habits.  Return in one year or sooner if any problems

## 2011-08-27 ENCOUNTER — Other Ambulatory Visit (HOSPITAL_COMMUNITY)
Admission: RE | Admit: 2011-08-27 | Discharge: 2011-08-27 | Disposition: A | Discharge status: Home / Self Care | Payer: Medicare Other | Source: Ambulatory Visit | Attending: Family Medicine | Admitting: Family Medicine

## 2011-08-27 ENCOUNTER — Ambulatory Visit (INDEPENDENT_AMBULATORY_CARE_PROVIDER_SITE_OTHER): Payer: Medicare Other | Admitting: Family Medicine

## 2011-08-27 ENCOUNTER — Encounter: Payer: Self-pay | Admitting: Family Medicine

## 2011-08-27 DIAGNOSIS — N952 Postmenopausal atrophic vaginitis: Secondary | ICD-10-CM

## 2011-08-27 DIAGNOSIS — Z01419 Encounter for gynecological examination (general) (routine) without abnormal findings: Secondary | ICD-10-CM | POA: Insufficient documentation

## 2011-08-27 NOTE — Progress Notes (Signed)
  Subjective:    Patient ID: Wendy Salazar, female    DOB: 1946/06/05, 65 y.o.   MRN: [id number]  HPI [name] is a 65 year old female, who comes in today for a follow-up Pap smear.  The cell sample.  We sent and was deemed insignificant and therefore, she should repeat that.  She's never had trouble with her paps.  The past.  She is a small amounts of the hormonal cream once weekly for vaginal dryness   Review of Systems    In general, and GYN review of systems otherwise negative Objective:   Physical Exam  Well-developed well-nourished, female in no acute distress .  Pelvic examination external genitalia within normal limits.  Vaginal vault was normal.  The cervix was visualized.  Pap smear was done.      Assessment & Plan:  Abnormal Pap repeat per directions.  The pathologist called patient reports

## 2011-08-27 NOTE — Patient Instructions (Signed)
I will Call u to report

## 2011-08-27 NOTE — Progress Notes (Signed)
Addended by: Kern Reap B on: 08/27/2011 02:43 PM   Modules accepted: Orders

## 2012-09-02 ENCOUNTER — Encounter: Payer: 59 | Admitting: Family Medicine

## 2012-09-15 ENCOUNTER — Other Ambulatory Visit: Payer: Self-pay | Admitting: Family Medicine

## 2012-09-15 ENCOUNTER — Ambulatory Visit (INDEPENDENT_AMBULATORY_CARE_PROVIDER_SITE_OTHER): Payer: 59 | Admitting: Family Medicine

## 2012-09-15 ENCOUNTER — Encounter: Payer: Self-pay | Admitting: Family Medicine

## 2012-09-15 VITALS — BP 110/70 | Temp 98.4°F | Ht 64.5 in | Wt 149.0 lb

## 2012-09-15 DIAGNOSIS — Z1231 Encounter for screening mammogram for malignant neoplasm of breast: Secondary | ICD-10-CM

## 2012-09-15 DIAGNOSIS — L659 Nonscarring hair loss, unspecified: Secondary | ICD-10-CM

## 2012-09-15 DIAGNOSIS — J309 Allergic rhinitis, unspecified: Secondary | ICD-10-CM

## 2012-09-15 DIAGNOSIS — L255 Unspecified contact dermatitis due to plants, except food: Secondary | ICD-10-CM

## 2012-09-15 DIAGNOSIS — I872 Venous insufficiency (chronic) (peripheral): Secondary | ICD-10-CM

## 2012-09-15 DIAGNOSIS — I10 Essential (primary) hypertension: Secondary | ICD-10-CM

## 2012-09-15 DIAGNOSIS — Z01419 Encounter for gynecological examination (general) (routine) without abnormal findings: Secondary | ICD-10-CM

## 2012-09-15 DIAGNOSIS — Z0000 Encounter for general adult medical examination without abnormal findings: Secondary | ICD-10-CM

## 2012-09-15 DIAGNOSIS — N952 Postmenopausal atrophic vaginitis: Secondary | ICD-10-CM

## 2012-09-15 LAB — CBC WITH DIFFERENTIAL/PLATELET
Basophils Relative: 0.7 % (ref 0.0–3.0)
Eosinophils Absolute: 0.2 10*3/uL (ref 0.0–0.7)
Eosinophils Relative: 4 % (ref 0.0–5.0)
Hemoglobin: 13.5 g/dL (ref 12.0–15.0)
Lymphocytes Relative: 38.6 % (ref 12.0–46.0)
MCHC: 32.3 g/dL (ref 30.0–36.0)
MCV: 92.2 fl (ref 78.0–100.0)
Monocytes Absolute: 0.4 10*3/uL (ref 0.1–1.0)
Neutro Abs: 1.9 10*3/uL (ref 1.4–7.7)
RBC: 4.52 Mil/uL (ref 3.87–5.11)
WBC: 4 10*3/uL — ABNORMAL LOW (ref 4.5–10.5)

## 2012-09-15 LAB — BASIC METABOLIC PANEL
CO2: 32 mEq/L (ref 19–32)
Calcium: 9.3 mg/dL (ref 8.4–10.5)
Chloride: 98 mEq/L (ref 96–112)
Glucose, Bld: 104 mg/dL — ABNORMAL HIGH (ref 70–99)
Potassium: 3.7 mEq/L (ref 3.5–5.1)
Sodium: 136 mEq/L (ref 135–145)

## 2012-09-15 LAB — POCT URINALYSIS DIPSTICK
Blood, UA: NEGATIVE
Glucose, UA: NEGATIVE
Ketones, UA: NEGATIVE
Spec Grav, UA: 1.02
Urobilinogen, UA: 0.2

## 2012-09-15 MED ORDER — ESTROGENS, CONJUGATED 0.625 MG/GM VA CREA: TOPICAL_CREAM | VAGINAL | Status: DC

## 2012-09-15 MED ORDER — TRIAMTERENE-HCTZ 75-50 MG PO TABS: 1.0000 | ORAL_TABLET | Freq: Every day | ORAL | Status: DC

## 2012-09-15 MED ORDER — FLUOCINONIDE 0.05 % EX CREA: 1.0000 "application " | TOPICAL_CREAM | Freq: Two times a day (BID) | CUTANEOUS | Status: DC | PRN

## 2012-09-15 MED ORDER — FLUTICASONE PROPIONATE 50 MCG/ACT NA SUSP: 2.0000 | Freq: Every day | NASAL | Status: DC | PRN

## 2012-09-15 NOTE — Patient Instructions (Signed)
Continue your current medication in good health habits  Return in one year sooner if any problems

## 2012-09-15 NOTE — Progress Notes (Signed)
  Subjective:    Patient ID: Wendy Salazar, female    DOB: 01-25-1946, 66 y.o.   MRN: [id number]  HPI Neleah is a 66 year old married female nonsmoker who comes in today for a Medicare wellness examination because of a history of underlying allergic rhinitis, post menopausal vaginal dryness, eczema, and peripheral edema secondary to venous insufficiency  She gets routine eye care, dental care, BSE monthly, last mammogram was 15 months ago. Encouraged to call and get set up for mammogram this fall. Colonoscopy and GI normal, tetanus times to 2009, Pneumovax x2, seasonal flu shot 2013, information given on shingles  Cognitive function normal she walks on a regular basis home health safety reviewed no issues identified, no guns in the house, she does have a health care power of attorney and living well   Review of Systems  Constitutional: Negative.   HENT: Negative.   Eyes: Negative.   Respiratory: Negative.   Cardiovascular: Negative.   Gastrointestinal: Negative.   Genitourinary: Negative.   Musculoskeletal: Negative.   Neurological: Negative.   Hematological: Negative.   Psychiatric/Behavioral: Negative.        Objective:   Physical Exam  Constitutional: She appears well-developed and well-nourished.  HENT:  Head: Normocephalic and atraumatic.  Right Ear: External ear normal.  Left Ear: External ear normal.  Nose: Nose normal.  Mouth/Throat: Oropharynx is clear and moist.  Eyes: EOM are normal. Pupils are equal, round, and reactive to light.  Neck: Normal range of motion. Neck supple. No thyromegaly present.  Cardiovascular: Normal rate, regular rhythm, normal heart sounds and intact distal pulses.  Exam reveals no gallop and no friction rub.   No murmur heard. Pulmonary/Chest: Effort normal and breath sounds normal.  Abdominal: Soft. Bowel sounds are normal. She exhibits no distension and no mass. There is no tenderness. There is no rebound.  Genitourinary:       Pelvic  rectal Pap smear last-year-old normal recommend Pap every 3 years going forward  Bilateral breast exam normal except for bilateral implants  Musculoskeletal: Normal range of motion.  Lymphadenopathy:    She has no cervical adenopathy.  Neurological: She is alert. She has normal reflexes. No cranial nerve deficit. She exhibits normal muscle tone. Coordination normal.  Skin: Skin is warm and dry.  Psychiatric: She has a normal mood and affect. Her behavior is normal. Judgment and thought content normal.          Assessment & Plan:  Healthy female  Allergic rhinitis continue Zyrtec and steroid nasal spray  Postmenopausal vaginal dryness continue Premarin vaginal cream  Eczema continue Lidex when necessary  Peripheral edema continue Maxzide 1 daily  Return in one year sooner if any problems

## 2012-09-29 ENCOUNTER — Other Ambulatory Visit: Payer: Self-pay | Admitting: Family Medicine

## 2012-10-23 ENCOUNTER — Ambulatory Visit
Admission: RE | Admit: 2012-10-23 | Discharge: 2012-10-23 | Disposition: A | Discharge status: Home / Self Care | Payer: Medicare Other | Source: Ambulatory Visit | Attending: Family Medicine | Admitting: Family Medicine

## 2012-10-23 DIAGNOSIS — Z1231 Encounter for screening mammogram for malignant neoplasm of breast: Secondary | ICD-10-CM

## 2013-01-02 ENCOUNTER — Telehealth: Payer: Self-pay | Admitting: Family Medicine

## 2013-01-02 ENCOUNTER — Ambulatory Visit (INDEPENDENT_AMBULATORY_CARE_PROVIDER_SITE_OTHER): Payer: 59 | Admitting: Family Medicine

## 2013-01-02 ENCOUNTER — Encounter: Payer: Self-pay | Admitting: Family Medicine

## 2013-01-02 VITALS — BP 110/82 | HR 92 | Temp 99.4°F | Wt 152.0 lb

## 2013-01-02 DIAGNOSIS — J209 Acute bronchitis, unspecified: Secondary | ICD-10-CM

## 2013-01-02 MED ORDER — AZITHROMYCIN 250 MG PO TABS: ORAL_TABLET | ORAL | Status: DC

## 2013-01-02 MED ORDER — FLUTICASONE PROPIONATE HFA 44 MCG/ACT IN AERO: 2.0000 | INHALATION_SPRAY | Freq: Two times a day (BID) | RESPIRATORY_TRACT | Status: DC

## 2013-01-02 MED ORDER — ALBUTEROL SULFATE HFA 108 (90 BASE) MCG/ACT IN AERS: 2.0000 | INHALATION_SPRAY | Freq: Four times a day (QID) | RESPIRATORY_TRACT | Status: DC | PRN

## 2013-01-02 NOTE — Progress Notes (Signed)
Chief Complaint  Patient presents with  . chest congestion    cough, fever, wheezing x 1 week     HPI:  Acute visit for cough and congestion: -started: about 1 week ago - feels a little better and fever has improved - but wheezing and chest symptoms worse -symptoms:nasal congestion, sore throat, cough, wheezing, rattling, subjective fevers, body aches -denies:objective fever, SOB, NVD, tooth pain, ear pain -has tried: musinex, zyrtec, ibuprofen -sick contacts: none known -Hx of: asthma, AR - asthma is mild and she is not prescribed any medications for this, flonase is on medication list - has had to take prednisone in the past - but doesn't want to take this   ROS: See pertinent positives and negatives per HPI.  Past Medical History  Diagnosis Date  . Allergic rhinitis   . Asthma   . HTN (hypertension)   . Urinary incontinence 2005  . PMS (premenstrual syndrome)   . Allergic eye reaction   . Hair loss     Family History  Problem Relation Age of Onset  . Alcohol abuse    . Diabetes    . Hypertension    . Prostate cancer      History   Social History  . Marital Status: Married    Spouse Name: N/A    Number of Children: N/A  . Years of Education: N/A   Social History Main Topics  . Smoking status: Never Smoker   . Smokeless tobacco: Never Used  . Alcohol Use: No  . Drug Use: No  . Sexually Active: None   Other Topics Concern  . None   Social History Narrative  . None    Current outpatient prescriptions:Calcium-Vitamin D-Vitamin K (CALCIUM + D + K PO), Take by mouth daily.  , Disp: , Rfl: ;  cetirizine (ZYRTEC) 10 MG tablet, Take 10 mg by mouth daily as needed.  , Disp: , Rfl: ;  conjugated estrogens (PREMARIN) vaginal cream, Place vaginally once a week., Disp: 42.5 g, Rfl: 11;  fluocinonide cream (LIDEX) 0.05 %, APPLY ONE APPLICATION TOPICALLY TWO TIMES DAILY AS NEEDED, Disp: 60 g, Rfl: 3 fluticasone (FLONASE) 50 MCG/ACT nasal spray, Place 2 sprays into the  nose daily as needed., Disp: 16 g, Rfl: 11;  olopatadine (PATANOL) 0.1 % ophthalmic solution, Apply 1 drop to eye 2 (two) times daily as needed., Disp: 5 mL, Rfl: 11;  triamterene-hydrochlorothiazide (MAXZIDE) 75-50 MG per tablet, Take 1 tablet by mouth daily. 1/2 tablet by mouth every morning, Disp: 100 tablet, Rfl: 3 albuterol (PROVENTIL HFA;VENTOLIN HFA) 108 (90 BASE) MCG/ACT inhaler, Inhale 2 puffs into the lungs every 6 (six) hours as needed for wheezing., Disp: 1 Inhaler, Rfl: 0;  azithromycin (ZITHROMAX) 250 MG tablet, 2 tabs on first day then 1 tab daily for 4 days, Disp: 6 tablet, Rfl: 0;  fluticasone (FLOVENT HFA) 44 MCG/ACT inhaler, Inhale 2 puffs into the lungs 2 (two) times daily., Disp: 1 Inhaler, Rfl: 0  EXAM:  Filed Vitals:   01/02/13 1109  BP: 110/82  Pulse: 92  Temp: 99.4 F (37.4 C)    Body mass index is 25.7 kg/(m^2).  GENERAL: vitals reviewed and listed above, alert, oriented, appears well hydrated and in no acute distress  HEENT: atraumatic, conjunttiva clear, no obvious abnormalities on inspection of external nose and ears, normal appearance of ear canals and TMs, clear nasal congestion, mild post oropharyngeal erythema with PND, no tonsillar edema or exudate, no sinus TTP  NECK: no obvious masses on inspection  LUNGS: diffuse exp wheezing, good air movement to all lung fields  CV: HRRR, no peripheral edema  MS: moves all extremities without noticeable abnormality  PSYCH: pleasant and cooperative, no obvious depression or anxiety  ASSESSMENT AND PLAN:  Discussed the following assessment and plan:  Acute bronchitis - Plan: azithromycin (ZITHROMAX) 250 MG tablet, fluticasone (FLOVENT HFA) 44 MCG/ACT inhaler, albuterol (PROVENTIL HFA;VENTOLIN HFA) 108 (90 BASE) MCG/ACT inhaler  -wheezing on exam and feels like worsening, no resp distress and good O2 sats - tx per orders - pt does not wish to take prednisone so will do inhaled steroid - risks of meds discussed,  return precautions discussed -Patient advised to return or notify a doctor immediately if symptoms worsen or persist or new concerns arise.  There are no Patient Instructions on file for this visit.   Kriste Basque R.

## 2013-01-02 NOTE — Telephone Encounter (Signed)
Patient Information:  Caller Name: Wendy Salazar  Phone: [phone]  Patient: Wendy Salazar, Wendy Salazar  Gender: Female  DOB: [birth year]  Age: 67 Years  PCP: Kelle Darting [facility]  Office Follow Up:  Does the office need to follow up with this patient?: No  Instructions For The Office: N/A   Symptoms  Reason For Call & Symptoms: Chest congestion. Cough with wheezing.  Wheezing started on 12/28/12. Afebrile.  No chest pain. Pt states she has Asthma and has no inhaler or nebulizer.  SOB with exertion.    Reviewed Health History In EMR: Yes  Reviewed Medications In EMR: Yes  Reviewed Allergies In EMR: Yes  Reviewed Surgeries / Procedures: Yes  Date of Onset of Symptoms: 12/22/2012  Treatments Tried: Zyrtec Nasal spray  Treatments Tried Worked: No  Guideline(s) Used:  Asthma Attack  Disposition Per Guideline:   See Today or Tomorrow in Office  Reason For Disposition Reached:   Mild asthma attack (e.g., no SOB at rest, mild SOB with walking, speaks normally in sentences, mild wheezing) and persists > 24 hours on appropriate treatment  Advice Given:  Drinking Liquids:  Try to drink normal amount of liquids (e.g., water). Being adequately hydrated makes it easier to cough up the sticky lung mucus.  Humidifier:   If the air is dry, use a cool mist humidifier to prevent drying of the upper airway.  Hay Fever  : If you have nasal symptoms from hay fever, it's OK to take antihistamines (Reasons: poor control of allergic rhinitis makes asthma worse whereas antihistamines don't make asthma worse).  Call Back If:  You become worse.  Patient Will Follow Care Advice:  YES  Appointment Scheduled:  01/02/2013 11:00:00 Appointment Scheduled Provider:  Kriste Basque [facility])

## 2013-04-30 ENCOUNTER — Ambulatory Visit (INDEPENDENT_AMBULATORY_CARE_PROVIDER_SITE_OTHER): Payer: Medicare FFS | Admitting: Family Medicine

## 2013-04-30 ENCOUNTER — Encounter: Payer: Self-pay | Admitting: Family Medicine

## 2013-04-30 VITALS — BP 98/68 | Temp 98.4°F | Wt 148.0 lb

## 2013-04-30 DIAGNOSIS — L259 Unspecified contact dermatitis, unspecified cause: Secondary | ICD-10-CM

## 2013-04-30 DIAGNOSIS — L309 Dermatitis, unspecified: Secondary | ICD-10-CM

## 2013-04-30 MED ORDER — PREDNISONE 10 MG PO TABS: ORAL_TABLET | ORAL | Status: DC

## 2013-04-30 NOTE — Patient Instructions (Signed)
-  use dove or aveeno hypoallergenic soap with no scent or color  -use only cerave cream or cetaphil restoraderm for moisterizing daily  -use hypoallergenic laundry detergent with no scent or color  -As we discussed, we have prescribed a new medication for you at this appointment. We discussed the common and serious potential adverse effects of this medication and you can review these and more with the pharmacist when you pick up your medication.  Please follow the instructions for use carefully and notify us immediately if you have any problems taking this medication.  -follow up with your doctor in 1-2 weeks or sooner if any concerns

## 2013-04-30 NOTE — Progress Notes (Signed)
Chief Complaint  Patient presents with  . Rash    itching all over; spreading; Benadryl     HPI:  67 yo pt of Dr. Nelida Meuse here for acute visit for rash: -hx of contact dermatitis, eczema in chart - has hx of needing prednisone -this started a few days ago after a lot of gardening -itchy rash all over, tried benadryl which did not help much -has chronic dry skin -denies: new exposures other then gardening and new olay wash, scabies exposure, SOB, swelling of mouth or tongue, fevers, pain -uses scented   ROS: See pertinent positives and negatives per HPI.  Past Medical History  Diagnosis Date  . Allergic rhinitis   . Asthma   . HTN (hypertension)   . Urinary incontinence 2005  . PMS (premenstrual syndrome)   . Allergic eye reaction   . Hair loss     Family History  Problem Relation Age of Onset  . Alcohol abuse    . Diabetes    . Hypertension    . Prostate cancer      History   Social History  . Marital Status: Married    Spouse Name: N/A    Number of Children: N/A  . Years of Education: N/A   Social History Main Topics  . Smoking status: Never Smoker   . Smokeless tobacco: Never Used  . Alcohol Use: No  . Drug Use: No  . Sexually Active: None   Other Topics Concern  . None   Social History Narrative  . None    Current outpatient prescriptions:albuterol (PROVENTIL HFA;VENTOLIN HFA) 108 (90 BASE) MCG/ACT inhaler, Inhale 2 puffs into the lungs every 6 (six) hours as needed for wheezing., Disp: 1 Inhaler, Rfl: 0;  Calcium-Vitamin D-Vitamin K (CALCIUM + D + K PO), Take by mouth daily.  , Disp: , Rfl: ;  cetirizine (ZYRTEC) 10 MG tablet, Take 10 mg by mouth daily as needed.  , Disp: , Rfl:  conjugated estrogens (PREMARIN) vaginal cream, Place vaginally once a week., Disp: 42.5 g, Rfl: 11;  fluocinonide cream (LIDEX) 0.05 %, APPLY ONE APPLICATION TOPICALLY TWO TIMES DAILY AS NEEDED, Disp: 60 g, Rfl: 3;  fluticasone (FLONASE) 50 MCG/ACT nasal spray, Place 2 sprays  into the nose daily as needed., Disp: 16 g, Rfl: 11;  fluticasone (FLOVENT HFA) 44 MCG/ACT inhaler, Inhale 2 puffs into the lungs 2 (two) times daily., Disp: 1 Inhaler, Rfl: 0 olopatadine (PATANOL) 0.1 % ophthalmic solution, Apply 1 drop to eye 2 (two) times daily as needed., Disp: 5 mL, Rfl: 11;  triamterene-hydrochlorothiazide (MAXZIDE) 75-50 MG per tablet, Take 1 tablet by mouth daily. 1/2 tablet by mouth every morning, Disp: 100 tablet, Rfl: 3 predniSONE (DELTASONE) 10 MG tablet, 40mg  (4 tablets) daily for 3 days, then 30mg  (3 tabs) daily for 3 days, then 20mg (2 tabs) daily for 3 days, then 10mg (1 tab) daily for 3 days then stop, Disp: 30 tablet, Rfl: 0  EXAM:  Filed Vitals:   04/30/13 1333  BP: 98/68  Temp: 98.4 F (36.9 C)    Body mass index is 25.02 kg/(m^2).  GENERAL: vitals reviewed and listed above, alert, oriented, appears well hydrated and in no acute distress  HEENT: atraumatic, conjunttiva clear, no obvious abnormalities on inspection of external nose and ears  NECK: no obvious masses on inspection  SKIN: dry skin throughout with with fine papular rash with excoriations on arms, legs, back and small amount on face  MS: moves all extremities without noticeable abnormality  PSYCH:  pleasant and cooperative, no obvious depression or anxiety  ASSESSMENT AND PLAN:  Discussed the following assessment and plan:  Eczema - Plan: predniSONE (DELTASONE) 10 MG tablet  Contact dermatitis - Plan: predniSONE (DELTASONE) 10 MG tablet  -seems this is a flare of her eczema versus contact dermatitis (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  There are no Patient Instructions on file for this visit.   Kriste Basque R.

## 2013-06-19 ENCOUNTER — Encounter: Payer: Self-pay | Admitting: Internal Medicine

## 2013-08-03 ENCOUNTER — Telehealth: Payer: Self-pay | Admitting: Family Medicine

## 2013-08-03 NOTE — Telephone Encounter (Signed)
Pt fell on Sun am. Woke up in the tub, not sure how she got there. No more symptoms except tailbone, shoulder pain. Pt did not go to ED. Pt wuld like to come in this week. No appts. Pls advise.

## 2013-08-04 NOTE — Telephone Encounter (Signed)
Please call patient.  Okay to use a same day appointment

## 2013-08-04 NOTE — Telephone Encounter (Signed)
Done

## 2013-08-06 ENCOUNTER — Ambulatory Visit (INDEPENDENT_AMBULATORY_CARE_PROVIDER_SITE_OTHER): Payer: 59 | Admitting: Family Medicine

## 2013-08-06 ENCOUNTER — Encounter: Payer: Self-pay | Admitting: Family Medicine

## 2013-08-06 VITALS — BP 130/80 | Temp 98.4°F | Wt 152.0 lb

## 2013-08-06 DIAGNOSIS — Z23 Encounter for immunization: Secondary | ICD-10-CM

## 2013-08-06 DIAGNOSIS — K6289 Other specified diseases of anus and rectum: Secondary | ICD-10-CM

## 2013-08-06 DIAGNOSIS — R55 Syncope and collapse: Secondary | ICD-10-CM

## 2013-08-06 DIAGNOSIS — Z2911 Encounter for prophylactic immunotherapy for respiratory syncytial virus (RSV): Secondary | ICD-10-CM

## 2013-08-06 DIAGNOSIS — G8929 Other chronic pain: Secondary | ICD-10-CM

## 2013-08-06 NOTE — Patient Instructions (Signed)
In the future when you start to feel this rectal pain lie down and put her feet up in years she'll pass out  Call GI closure due for your colonoscopy and be sure to tell them about this problem

## 2013-08-06 NOTE — Addendum Note (Signed)
Addended by: Kern Reap B on: 08/06/2013 05:25 PM   Modules accepted: Orders

## 2013-08-06 NOTE — Progress Notes (Signed)
  Subjective:    Patient ID: Wendy Salazar, female    DOB: 1945-10-27, 67 y.o.   MRN: [id number]  HPI Wendy Salazar is a delightful 67 year old female nonsmoker who comes in today for evaluation of rectal pain and a syncopal episode  She says for about a year and now she's had off and on episodes maybe once a month of sharp rectal pain but only last for a short period time and it's a 4-5 on a scale of 1-10  On this past Sunday morning she woke up with severe pain in her rectum which she describes as an 8+. She remembers being in the bathroom and the next thing she knows she was in the tub. Fortunately was no water in the tub. She was able to crawl that time and feels otherwise well except she's got some soreness in the back of her scalp and her tailbone.  She's due for followup colonoscopy  She's had no fever chills nausea vomiting diarrhea change in bowel habits. No history of trauma   Review of Systems Review of systems otherwise negative    Objective:   Physical Exam Well-developed and nourished female no acute distress examination of the scalp was normal except for some slight tenderness mid posterior no hematoma  No palpable tenderness of the spine  Tailbone normal  Rectal exam........... the rectum appears normal. Digital rectal exam was done without pain stool guaiac-negative no palpable masses         Assessment & Plan:  Tenesimus  Syncopal episode secondary to above  GI evaluation

## 2013-09-02 ENCOUNTER — Encounter: Payer: Self-pay | Admitting: Internal Medicine

## 2013-09-21 ENCOUNTER — Encounter: Payer: Medicare FFS | Admitting: Family Medicine

## 2013-09-28 ENCOUNTER — Ambulatory Visit (INDEPENDENT_AMBULATORY_CARE_PROVIDER_SITE_OTHER): Payer: 59 | Admitting: Family Medicine

## 2013-09-28 ENCOUNTER — Encounter: Payer: Self-pay | Admitting: Family Medicine

## 2013-09-28 VITALS — BP 110/80 | Temp 97.7°F | Ht 65.75 in | Wt 147.0 lb

## 2013-09-28 DIAGNOSIS — Z01419 Encounter for gynecological examination (general) (routine) without abnormal findings: Secondary | ICD-10-CM

## 2013-09-28 DIAGNOSIS — I10 Essential (primary) hypertension: Secondary | ICD-10-CM

## 2013-09-28 DIAGNOSIS — Z23 Encounter for immunization: Secondary | ICD-10-CM

## 2013-09-28 DIAGNOSIS — J309 Allergic rhinitis, unspecified: Secondary | ICD-10-CM

## 2013-09-28 DIAGNOSIS — Z0000 Encounter for general adult medical examination without abnormal findings: Secondary | ICD-10-CM

## 2013-09-28 DIAGNOSIS — N952 Postmenopausal atrophic vaginitis: Secondary | ICD-10-CM

## 2013-09-28 DIAGNOSIS — L255 Unspecified contact dermatitis due to plants, except food: Secondary | ICD-10-CM

## 2013-09-28 DIAGNOSIS — J45909 Unspecified asthma, uncomplicated: Secondary | ICD-10-CM

## 2013-09-28 DIAGNOSIS — I872 Venous insufficiency (chronic) (peripheral): Secondary | ICD-10-CM

## 2013-09-28 DIAGNOSIS — J209 Acute bronchitis, unspecified: Secondary | ICD-10-CM

## 2013-09-28 LAB — CBC WITH DIFFERENTIAL/PLATELET
Basophils Absolute: 0 10*3/uL (ref 0.0–0.1)
Eosinophils Relative: 3.7 % (ref 0.0–5.0)
HCT: 41.5 % (ref 36.0–46.0)
Lymphocytes Relative: 39.3 % (ref 12.0–46.0)
Lymphs Abs: 1.7 10*3/uL (ref 0.7–4.0)
Monocytes Relative: 11 % (ref 3.0–12.0)
Platelets: 237 10*3/uL (ref 150.0–400.0)
WBC: 4.2 10*3/uL — ABNORMAL LOW (ref 4.5–10.5)

## 2013-09-28 LAB — BASIC METABOLIC PANEL
BUN: 15 mg/dL (ref 6–23)
CO2: 33 mEq/L — ABNORMAL HIGH (ref 19–32)
Calcium: 9.7 mg/dL (ref 8.4–10.5)
Chloride: 100 mEq/L (ref 96–112)
Creatinine, Ser: 0.8 mg/dL (ref 0.4–1.2)
GFR: 91.33 mL/min (ref >60.00)
Sodium: 139 mEq/L (ref 135–145)

## 2013-09-28 LAB — POCT URINALYSIS DIPSTICK
Bilirubin, UA: NEGATIVE
Glucose, UA: NEGATIVE
Leukocytes, UA: NEGATIVE
Nitrite, UA: NEGATIVE

## 2013-09-28 MED ORDER — OLOPATADINE HCL 0.1 % OP SOLN: 1.0000 [drp] | Freq: Two times a day (BID) | OPHTHALMIC | Status: DC | PRN

## 2013-09-28 MED ORDER — ESTROGENS, CONJUGATED 0.625 MG/GM VA CREA: TOPICAL_CREAM | VAGINAL | Status: DC

## 2013-09-28 MED ORDER — TRIAMTERENE-HCTZ 75-50 MG PO TABS: 1.0000 | ORAL_TABLET | Freq: Every day | ORAL | Status: DC

## 2013-09-28 MED ORDER — ALBUTEROL SULFATE HFA 108 (90 BASE) MCG/ACT IN AERS: 2.0000 | INHALATION_SPRAY | Freq: Four times a day (QID) | RESPIRATORY_TRACT | Status: DC | PRN

## 2013-09-28 MED ORDER — FLUTICASONE PROPIONATE HFA 44 MCG/ACT IN AERO: 2.0000 | INHALATION_SPRAY | Freq: Two times a day (BID) | RESPIRATORY_TRACT | Status: DC

## 2013-09-28 MED ORDER — FLUOCINONIDE 0.05 % EX CREA: TOPICAL_CREAM | CUTANEOUS | Status: DC

## 2013-09-28 NOTE — Progress Notes (Signed)
   Subjective:    Patient ID: Wendy Salazar, female    DOB: 07-22-1946, 68 y.o.   MRN: [id number]  HPI Wendy Salazar is a 67 year old female nonsmoker who comes in today for a Medicare wellness examination because of a history of atrophic vaginitis, venous insufficiency, contact dermatitis, hypertension, allergic rhinitis, asthma,  Her med list reviewed in the been no changes  Her asthma typically only bothers her in the summer. She therefore takes her Flovent from April through October and then stops and when her and she's fine.  She gets routine eye care, dental care, BSE monthly, and you mammography, colonoscopy up to date  Vaccinations up-to-date........ she needs a Chiropractor function normal she exercises on a regular basis home health safety reviewed no issues identified, no guns in the house, she does have a health care power of attorney and living well     Review of Systems  Constitutional: Negative.   HENT: Negative.   Eyes: Negative.   Respiratory: Negative.   Cardiovascular: Negative.   Gastrointestinal: Negative.   Endocrine: Negative.   Genitourinary: Negative.   Musculoskeletal: Negative.   Allergic/Immunologic: Negative.   Neurological: Negative.   Hematological: Negative.   Psychiatric/Behavioral: Negative.        Objective:   Physical Exam  Constitutional: She appears well-developed and well-nourished.  HENT:  Head: Normocephalic and atraumatic.  Right Ear: External ear normal.  Left Ear: External ear normal.  Nose: Nose normal.  Mouth/Throat: Oropharynx is clear and moist.  Eyes: EOM are normal. Pupils are equal, round, and reactive to light.  Neck: Normal range of motion. Neck supple. No thyromegaly present.  Cardiovascular: Normal rate, regular rhythm, normal heart sounds and intact distal pulses.  Exam reveals no gallop and no friction rub.   No murmur heard. Pulmonary/Chest: Effort normal and breath sounds normal.  Abdominal: Soft. Bowel  sounds are normal. She exhibits no distension and no mass. There is no tenderness. There is no rebound.  Abdominal scars from liposuction  Genitourinary: Vagina normal and uterus normal. Guaiac negative stool. No vaginal discharge found.  Bilateral breast exam shows a normal breast tissue with implants scars from implants no palpable masses  Musculoskeletal: Normal range of motion.  Lymphadenopathy:    She has no cervical adenopathy.  Neurological: She is alert. She has normal reflexes. No cranial nerve deficit. She exhibits normal muscle tone. Coordination normal.  Skin: Skin is warm and dry.  Psychiatric: She has a normal mood and affect. Her behavior is normal. Judgment and thought content normal.   she does have an endocervical polyp has been present for many years no change in his not bleeding        Assessment & Plan:  Healthy female  History of asthma........ only in the summer. Therefore the Flovent one puff twice a day April through October  Venous insufficiency continue Maxzide one half tab daily  Allergic rhinitis continue Zyrtec and steroid nasal spray  Eczema continue Lidex  Postmenopausal vaginal dryness continue the estrogen cream twice weekly  .....Marland KitchenMarland Kitchen

## 2013-09-28 NOTE — Progress Notes (Signed)
Pre visit review using our clinic review tool, if applicable. No additional management support is needed unless otherwise documented below in the visit note. 

## 2013-09-28 NOTE — Patient Instructions (Signed)
Continue your current medications  Because your asthma only bothers you in the summer I would use the Flovent one puff twice daily from April through October  Return in one year for general physical exam sooner if any problems

## 2013-10-28 ENCOUNTER — Ambulatory Visit: Payer: Self-pay | Admitting: Ophthalmology

## 2013-10-28 HISTORY — PX: CATARACT EXTRACTION: SUR2

## 2013-11-04 ENCOUNTER — Ambulatory Visit (AMBULATORY_SURGERY_CENTER): Payer: Medicare PPO | Admitting: *Deleted

## 2013-11-04 VITALS — Ht 64.0 in | Wt 148.6 lb

## 2013-11-04 DIAGNOSIS — Z86010 Personal history of colonic polyps: Secondary | ICD-10-CM

## 2013-11-04 MED ORDER — MOVIPREP 100 G PO SOLR: ORAL | Status: DC

## 2013-11-04 NOTE — Progress Notes (Signed)
No allergies to eggs or soy. No problems with anesthesia.  

## 2013-11-05 ENCOUNTER — Encounter: Payer: Self-pay | Admitting: Internal Medicine

## 2013-11-18 ENCOUNTER — Ambulatory Visit (AMBULATORY_SURGERY_CENTER): Payer: 59 | Admitting: Internal Medicine

## 2013-11-18 ENCOUNTER — Encounter: Payer: Self-pay | Admitting: Internal Medicine

## 2013-11-18 VITALS — BP 131/74 | HR 72 | Temp 97.6°F | Resp 20 | Ht 64.0 in | Wt 148.0 lb

## 2013-11-18 DIAGNOSIS — D126 Benign neoplasm of colon, unspecified: Secondary | ICD-10-CM

## 2013-11-18 DIAGNOSIS — Z860100 Personal history of colon polyps, unspecified: Secondary | ICD-10-CM

## 2013-11-18 MED ORDER — SODIUM CHLORIDE 0.9 % IV SOLN: 500.0000 mL | INTRAVENOUS | Status: DC

## 2013-11-18 NOTE — Op Note (Signed)
[facility]  [address] [place], [address]   COLONOSCOPY PROCEDURE REPORT  PATIENT: Wendy Salazar, Wendy Salazar  MR#: [id number] BIRTHDATE: Jul 18, 1946 , 67  yrs. old GENDER: Female ENDOSCOPIST: Lafayette Dragon, MD REFERRED NW:[id number] Delora Fuel, M.D. PROCEDURE DATE:  11/18/2013 PROCEDURE:   Colonoscopy with cold biopsy polypectomy First Screening Colonoscopy - Avg.  risk and is 50 yrs.  old or older - No.  Prior Negative Screening - Now for repeat screening. N/A  History of Adenoma - Now for follow-up colonoscopy & has been > or = to 3 yrs.  N/A  Polyps Removed Today? Yes. ASA CLASS:   Class II INDICATIONS:Patient's immediate family history of colon cancer and sibling with colon cancer.  Last colonoscopy in November 2008 showed hyperplastic polyp. MEDICATIONS: MAC sedation, administered by CRNA and propofol (Diprivan) 200mg  IV  DESCRIPTION OF PROCEDURE:   After the risks benefits and alternatives of the procedure were thoroughly explained, informed consent was obtained.  A digital rectal exam revealed no abnormalities of the rectum.   The LB PFC-H190 [id number], LB PFC-H190 [id number], and LB PFC-H190 [id number]  endoscope was introduced through the anus and advanced to the cecum, which was identified by both the appendix and ileocecal valve. No adverse events experienced. The quality of the prep was excellent, using MoviPrep  The instrument was then slowly withdrawn as the colon was fully examined.      COLON FINDINGS: A polypoid shaped sessile polyp ranging between 3-14mm in size was found in the sigmoid colon.  A polypectomy was performed with cold forceps.  The resection was complete and the polyp tissue was completely retrieved.  Retroflexed views revealed no abnormalities. The time to cecum=6 minutes 10 seconds. Withdrawal time=9 minutes 36 seconds.  The scope was withdrawn and the procedure completed. COMPLICATIONS: There were no complications.  ENDOSCOPIC  IMPRESSION: Sessile polyp ranging between 3-1mm in size was found in the sigmoid colon; polypectomy was performed with cold forceps  RECOMMENDATIONS: 1.  Await pathology results 2.  high-fiber diet Recall colonoscopy in 5 years   eSigned:  Lafayette Dragon, MD 11/18/2013 11:56 AM   cc:   PATIENT NAME:  Wendy Salazar, Wendy Salazar MR#: [id number]

## 2013-11-18 NOTE — Progress Notes (Signed)
Stable to RR 

## 2013-11-18 NOTE — Patient Instructions (Signed)
YOU HAD AN ENDOSCOPIC PROCEDURE TODAY AT THE [facility]: Refer to the procedure report that was given to you for any specific questions about what was found during the examination.  If the procedure report does not answer your questions, please call your gastroenterologist to clarify.  If you requested that your care partner not be given the details of your procedure findings, then the procedure report has been included in a sealed envelope for you to review at your convenience later.  YOU SHOULD EXPECT: Some feelings of bloating in the abdomen. Passage of more gas than usual.  Walking can help get rid of the air that was put into your GI tract during the procedure and reduce the bloating. If you had a lower endoscopy (such as a colonoscopy or flexible sigmoidoscopy) you may notice spotting of blood in your stool or on the toilet paper. If you underwent a bowel prep for your procedure, then you may not have a normal bowel movement for a few days.  DIET: Your first meal following the procedure should be a light meal and then it is ok to progress to your normal diet.  A half-sandwich or bowl of soup is an example of a good first meal.  Heavy or fried foods are harder to digest and may make you feel nauseous or bloated.  Likewise meals heavy in dairy and vegetables can cause extra gas to form and this can also increase the bloating.  Drink plenty of fluids but you should avoid alcoholic beverages for 24 hours.  ACTIVITY: Your care partner should take you home directly after the procedure.  You should plan to take it easy, moving slowly for the rest of the day.  You can resume normal activity the day after the procedure however you should NOT DRIVE or use heavy machinery for 24 hours (because of the sedation medicines used during the test).    SYMPTOMS TO REPORT IMMEDIATELY: A gastroenterologist can be reached at any hour.  During normal business hours, 8:30 AM to 5:00 PM Monday through Friday,  call [phone].  After hours and on weekends, please call the GI answering service at [phone] who will take a message and have the physician on call contact you.   Following lower endoscopy (colonoscopy or flexible sigmoidoscopy):  Excessive amounts of blood in the stool  Significant tenderness or worsening of abdominal pains  Swelling of the abdomen that is new, acute  Fever of 100F or higher    FOLLOW UP: If any biopsies were taken you will be contacted by phone or by letter within the next 1-3 weeks.  Call your gastroenterologist if you have not heard about the biopsies in 3 weeks.  Our staff will call the home number listed on your records the next business day following your procedure to check on you and address any questions or concerns that you may have at that time regarding the information given to you following your procedure. This is a courtesy call and so if there is no answer at the home number and we have not heard from you through the emergency physician on call, we will assume that you have returned to your regular daily activities without incident.  SIGNATURES/CONFIDENTIALITY: You and/or your care partner have signed paperwork which will be entered into your electronic medical record.  These signatures attest to the fact that that the information above on your After Visit Summary has been reviewed and is understood.  Full responsibility of the confidentiality   of this discharge information lies with you and/or your care-partner.  Polyp and high fiber diet information given.  Next colonoscopy 5 years-2020

## 2013-11-18 NOTE — Progress Notes (Signed)
Called to room to assist after endoscopic procedure.  Patient ID and intended procedure confirmed with present staff. Received instructions for my participation in the procedure from the performing physician.

## 2013-11-19 ENCOUNTER — Telehealth: Payer: Self-pay | Admitting: *Deleted

## 2013-11-19 NOTE — Telephone Encounter (Signed)
  Follow up Call-  Call back number 11/18/2013  Post procedure Call Back phone  # [phone]  Permission to leave phone message Yes     Patient questions:  Do you have a fever, pain , or abdominal swelling? no Pain Score  0 *  Have you tolerated food without any problems? yes  Have you been able to return to your normal activities? yes  Do you have any questions about your discharge instructions: Diet   no Medications  no Follow up visit  no  Do you have questions or concerns about your Care? no  Actions: * If pain score is 4 or above: No action needed, pain <4.

## 2013-11-24 ENCOUNTER — Encounter: Payer: Self-pay | Admitting: Internal Medicine

## 2013-11-25 ENCOUNTER — Ambulatory Visit: Payer: Self-pay | Admitting: Ophthalmology

## 2013-11-25 ENCOUNTER — Encounter: Payer: Self-pay | Admitting: *Deleted

## 2014-11-03 ENCOUNTER — Encounter: Payer: Self-pay | Admitting: Family Medicine

## 2014-11-03 ENCOUNTER — Ambulatory Visit (INDEPENDENT_AMBULATORY_CARE_PROVIDER_SITE_OTHER): Payer: Medicare PPO | Admitting: Family Medicine

## 2014-11-03 ENCOUNTER — Other Ambulatory Visit (HOSPITAL_COMMUNITY)
Admission: RE | Admit: 2014-11-03 | Discharge: 2014-11-03 | Disposition: A | Discharge status: Home / Self Care | Payer: Medicare PPO | Source: Ambulatory Visit | Attending: Family Medicine | Admitting: Family Medicine

## 2014-11-03 VITALS — BP 110/80 | Temp 97.5°F | Ht 65.5 in | Wt 150.0 lb

## 2014-11-03 DIAGNOSIS — I10 Essential (primary) hypertension: Secondary | ICD-10-CM

## 2014-11-03 DIAGNOSIS — N952 Postmenopausal atrophic vaginitis: Secondary | ICD-10-CM

## 2014-11-03 DIAGNOSIS — Z1151 Encounter for screening for human papillomavirus (HPV): Secondary | ICD-10-CM | POA: Insufficient documentation

## 2014-11-03 DIAGNOSIS — J209 Acute bronchitis, unspecified: Secondary | ICD-10-CM

## 2014-11-03 DIAGNOSIS — Z124 Encounter for screening for malignant neoplasm of cervix: Secondary | ICD-10-CM | POA: Diagnosis present

## 2014-11-03 DIAGNOSIS — Z01419 Encounter for gynecological examination (general) (routine) without abnormal findings: Secondary | ICD-10-CM

## 2014-11-03 DIAGNOSIS — I872 Venous insufficiency (chronic) (peripheral): Secondary | ICD-10-CM

## 2014-11-03 DIAGNOSIS — J4520 Mild intermittent asthma, uncomplicated: Secondary | ICD-10-CM

## 2014-11-03 DIAGNOSIS — N841 Polyp of cervix uteri: Secondary | ICD-10-CM | POA: Insufficient documentation

## 2014-11-03 DIAGNOSIS — J302 Other seasonal allergic rhinitis: Secondary | ICD-10-CM

## 2014-11-03 LAB — BASIC METABOLIC PANEL
BUN: 19 mg/dL (ref 6–23)
CO2: 34 mEq/L — ABNORMAL HIGH (ref 19–32)
Calcium: 9.8 mg/dL (ref 8.4–10.5)
Chloride: 101 mEq/L (ref 96–112)
Creatinine, Ser: 0.81 mg/dL (ref 0.40–1.20)
GFR: 89.74 mL/min (ref >60.00)
Glucose, Bld: 85 mg/dL (ref 70–99)
Potassium: 4.1 mEq/L (ref 3.5–5.1)
SODIUM: 140 meq/L (ref 135–145)

## 2014-11-03 LAB — CBC WITH DIFFERENTIAL/PLATELET
BASOS PCT: 0.8 % (ref 0.0–3.0)
Basophils Absolute: 0 10*3/uL (ref 0.0–0.1)
EOS PCT: 4.2 % (ref 0.0–5.0)
Eosinophils Absolute: 0.2 10*3/uL (ref 0.0–0.7)
HCT: 43.3 % (ref 36.0–46.0)
HEMOGLOBIN: 13.8 g/dL (ref 12.0–15.0)
LYMPHS ABS: 1.7 10*3/uL (ref 0.7–4.0)
Lymphocytes Relative: 38.6 % (ref 12.0–46.0)
MCHC: 31.9 g/dL (ref 30.0–36.0)
MCV: 92.5 fl (ref 78.0–100.0)
MONO ABS: 0.6 10*3/uL (ref 0.1–1.0)
MONOS PCT: 13 % — AB (ref 3.0–12.0)
Neutro Abs: 1.9 10*3/uL (ref 1.4–7.7)
Neutrophils Relative %: 43.4 % (ref 43.0–77.0)
Platelets: 253 10*3/uL (ref 150.0–400.0)
RBC: 4.68 Mil/uL (ref 3.87–5.11)
RDW: 13.7 % (ref 11.5–15.5)
WBC: 4.5 10*3/uL (ref 4.0–10.5)

## 2014-11-03 LAB — POCT URINALYSIS DIPSTICK
BILIRUBIN UA: NEGATIVE
Blood, UA: NEGATIVE
Glucose, UA: NEGATIVE
KETONES UA: NEGATIVE
Nitrite, UA: NEGATIVE
Protein, UA: NEGATIVE
Spec Grav, UA: 1.02
Urobilinogen, UA: 0.2
pH, UA: 7

## 2014-11-03 LAB — TSH: TSH: 3.02 u[IU]/mL (ref 0.35–4.50)

## 2014-11-03 MED ORDER — TRIAMTERENE-HCTZ 75-50 MG PO TABS
1.0000 | ORAL_TABLET | Freq: Every day | ORAL | Status: DC
Start: 2014-11-03 — End: 2014-11-04

## 2014-11-03 MED ORDER — ALBUTEROL SULFATE HFA 108 (90 BASE) MCG/ACT IN AERS
2.0000 | INHALATION_SPRAY | Freq: Four times a day (QID) | RESPIRATORY_TRACT | Status: DC | PRN
Start: 2014-11-03 — End: 2016-11-27

## 2014-11-03 MED ORDER — ESTROGENS, CONJUGATED 0.625 MG/GM VA CREA: TOPICAL_CREAM | VAGINAL | Status: DC

## 2014-11-03 NOTE — Progress Notes (Signed)
   Subjective:    Patient ID: Wendy Salazar, female    DOB: 1946-08-03, 69 y.o.   MRN: [id number]  HPI Wendy Salazar is a 69 year old married female nonsmoker who comes in today for annual physical examination because of a history of underlying hypertension  She takes Maxide 75-50 daily for hypertension BP today 110/80  She has a history of allergic rhinitis and uses Zyrtec and steroid nasal spray when necessary  She has postmenopausal vaginal dryness and uses hormonal cream once or twice weekly  She takes albuterol when necessary for asthma. She was on Flovent daily but stopped it. She only had one episode of wheezing this year that was controlled with small amounts of albuterol.  She gets routine eye care, dental care, BSE monthly, annual mammography, recent colonoscopy normal  Vaccinations up-to-date  Cognitive function normal she walks daily home health safety reviewed no issues identified, no guns in the house, she does have a healthcare power of attorney and living well   Review of Systems  Constitutional: Negative.   HENT: Negative.   Eyes: Negative.   Respiratory: Negative.   Cardiovascular: Negative.   Gastrointestinal: Negative.   Endocrine: Negative.   Genitourinary: Negative.   Musculoskeletal: Negative.   Skin: Negative.   Allergic/Immunologic: Negative.   Neurological: Negative.   Hematological: Negative.   Psychiatric/Behavioral: Negative.        Objective:   Physical Exam  Constitutional: She appears well-developed and well-nourished.  HENT:  Head: Normocephalic and atraumatic.  Right Ear: External ear normal.  Left Ear: External ear normal.  Nose: Nose normal.  Mouth/Throat: Oropharynx is clear and moist.  Eyes: EOM are normal. Pupils are equal, round, and reactive to light.  Neck: Normal range of motion. Neck supple. No JVD present. No tracheal deviation present. No thyromegaly present.  Cardiovascular: Normal rate, regular rhythm, normal heart sounds  and intact distal pulses.  Exam reveals no gallop and no friction rub.   No murmur heard. Pulmonary/Chest: Effort normal and breath sounds normal. No stridor. No respiratory distress. She has no wheezes. She has no rales. She exhibits no tenderness.  Abdominal: Soft. Bowel sounds are normal. She exhibits no distension and no mass. There is no tenderness. There is no rebound and no guarding.  Genitourinary: Vagina normal and uterus normal. Guaiac negative stool. No vaginal discharge found.  Bilateral breast exam normal  Small cervical polyp  Musculoskeletal: Normal range of motion.  Lymphadenopathy:    She has no cervical adenopathy.  Neurological: She is alert. She has normal reflexes. No cranial nerve deficit. She exhibits normal muscle tone. Coordination normal.  Skin: Skin is warm and dry. No rash noted. No erythema. No pallor.  Total body skin exam normal except for scar right and left breast and lower abdomen from previous plastic surgery  Psychiatric: She has a normal mood and affect. Her behavior is normal. Judgment and thought content normal.  Nursing note and vitals reviewed.         Assessment & Plan:  Healthy female  Hypertension ago continue current therapy  Allergic rhinitis Zyrtec and steroid nasal spray when necessary  Occasional asthma albuterol when necessary  Postmenopausal vaginal dryness continue hormonal cream once or twice weekly

## 2014-11-03 NOTE — Patient Instructions (Signed)
Continue the albuterol 1 or 2+3 times daily when necessary  Stop the Flovent  Continue other medications  Walk 30 minutes daily  Healthcare power of attorney and living well  Return in one year sooner if any problems

## 2014-11-03 NOTE — Progress Notes (Signed)
Pre visit review using our clinic review tool, if applicable. No additional management support is needed unless otherwise documented below in the visit note. 

## 2014-11-04 ENCOUNTER — Other Ambulatory Visit: Payer: Self-pay | Admitting: *Deleted

## 2014-11-04 DIAGNOSIS — I872 Venous insufficiency (chronic) (peripheral): Secondary | ICD-10-CM

## 2014-11-04 DIAGNOSIS — I10 Essential (primary) hypertension: Secondary | ICD-10-CM

## 2014-11-04 DIAGNOSIS — H35351 Cystoid macular degeneration, right eye: Secondary | ICD-10-CM | POA: Diagnosis not present

## 2014-11-04 LAB — CYTOLOGY - PAP

## 2014-11-04 MED ORDER — TRIAMTERENE-HCTZ 75-50 MG PO TABS: 1.0000 | ORAL_TABLET | Freq: Every day | ORAL | Status: DC

## 2014-12-21 DIAGNOSIS — H35351 Cystoid macular degeneration, right eye: Secondary | ICD-10-CM | POA: Diagnosis not present

## 2015-03-14 DIAGNOSIS — H35351 Cystoid macular degeneration, right eye: Secondary | ICD-10-CM | POA: Diagnosis not present

## 2015-03-23 ENCOUNTER — Telehealth: Payer: Self-pay | Admitting: *Deleted

## 2015-03-23 NOTE — Telephone Encounter (Signed)
Left a message for the pt to call the office back (need date of most recent mammogram). 

## 2015-05-05 ENCOUNTER — Encounter: Payer: Self-pay | Admitting: Internal Medicine

## 2015-05-13 DIAGNOSIS — H20041 Secondary noninfectious iridocyclitis, right eye: Secondary | ICD-10-CM | POA: Diagnosis not present

## 2015-05-13 DIAGNOSIS — H35351 Cystoid macular degeneration, right eye: Secondary | ICD-10-CM | POA: Diagnosis not present

## 2015-08-12 DIAGNOSIS — H40009 Preglaucoma, unspecified, unspecified eye: Secondary | ICD-10-CM | POA: Diagnosis not present

## 2015-08-29 DIAGNOSIS — H40003 Preglaucoma, unspecified, bilateral: Secondary | ICD-10-CM | POA: Diagnosis not present

## 2015-10-21 ENCOUNTER — Other Ambulatory Visit: Payer: Self-pay

## 2015-10-21 DIAGNOSIS — Z1231 Encounter for screening mammogram for malignant neoplasm of breast: Secondary | ICD-10-CM

## 2015-11-14 ENCOUNTER — Ambulatory Visit (INDEPENDENT_AMBULATORY_CARE_PROVIDER_SITE_OTHER): Payer: Medicare Other | Admitting: Family Medicine

## 2015-11-14 ENCOUNTER — Encounter: Payer: Self-pay | Admitting: Family Medicine

## 2015-11-14 VITALS — BP 130/80 | Temp 98.9°F | Ht 65.5 in | Wt 150.0 lb

## 2015-11-14 DIAGNOSIS — Z0000 Encounter for general adult medical examination without abnormal findings: Secondary | ICD-10-CM

## 2015-11-14 DIAGNOSIS — N952 Postmenopausal atrophic vaginitis: Secondary | ICD-10-CM

## 2015-11-14 DIAGNOSIS — I10 Essential (primary) hypertension: Secondary | ICD-10-CM

## 2015-11-14 DIAGNOSIS — Z01419 Encounter for gynecological examination (general) (routine) without abnormal findings: Secondary | ICD-10-CM

## 2015-11-14 LAB — POCT URINALYSIS DIPSTICK
Bilirubin, UA: NEGATIVE
Blood, UA: NEGATIVE
GLUCOSE UA: NEGATIVE
Ketones, UA: NEGATIVE
Leukocytes, UA: NEGATIVE
Nitrite, UA: NEGATIVE
Protein, UA: NEGATIVE
Urobilinogen, UA: 0.2
pH, UA: 5.5

## 2015-11-14 LAB — CBC WITH DIFFERENTIAL/PLATELET
BASOS PCT: 0.8 % (ref 0.0–3.0)
Basophils Absolute: 0 10*3/uL (ref 0.0–0.1)
EOS PCT: 3.6 % (ref 0.0–5.0)
Eosinophils Absolute: 0.1 10*3/uL (ref 0.0–0.7)
HEMATOCRIT: 39.5 % (ref 36.0–46.0)
HEMOGLOBIN: 12.9 g/dL (ref 12.0–15.0)
LYMPHS PCT: 35.3 % (ref 12.0–46.0)
Lymphs Abs: 1.4 10*3/uL (ref 0.7–4.0)
MCHC: 32.7 g/dL (ref 30.0–36.0)
MCV: 90.3 fl (ref 78.0–100.0)
Monocytes Absolute: 0.4 10*3/uL (ref 0.1–1.0)
Monocytes Relative: 10.9 % (ref 3.0–12.0)
Neutro Abs: 1.9 10*3/uL (ref 1.4–7.7)
Neutrophils Relative %: 49.4 % (ref 43.0–77.0)
PLATELETS: 230 10*3/uL (ref 150.0–400.0)
RBC: 4.38 Mil/uL (ref 3.87–5.11)
RDW: 13.4 % (ref 11.5–15.5)
WBC: 3.9 10*3/uL — ABNORMAL LOW (ref 4.0–10.5)

## 2015-11-14 LAB — TSH: TSH: 2.36 u[IU]/mL (ref 0.35–4.50)

## 2015-11-14 LAB — BASIC METABOLIC PANEL
BUN: 15 mg/dL (ref 6–23)
CALCIUM: 9.4 mg/dL (ref 8.4–10.5)
CO2: 32 meq/L (ref 19–32)
Chloride: 104 mEq/L (ref 96–112)
Creatinine, Ser: 0.89 mg/dL (ref 0.40–1.20)
GFR: 80.26 mL/min (ref >60.00)
GLUCOSE: 88 mg/dL (ref 70–99)
POTASSIUM: 4.4 meq/L (ref 3.5–5.1)
SODIUM: 142 meq/L (ref 135–145)

## 2015-11-14 MED ORDER — ESTROGENS, CONJUGATED 0.625 MG/GM VA CREA: TOPICAL_CREAM | VAGINAL | Status: DC

## 2015-11-14 NOTE — Progress Notes (Signed)
   Subjective:    Patient ID: Wendy Salazar, female    DOB: 10/27/1945, 70 y.o.   MRN: [id number]  HPI Wendy Salazar is a 70 year old married female nonsmoker G2 P2 LMP around age 37 who comes in today for general physical examination  She has a history of mild hypertension on Maxide 75-25 daily BP 130/80  She gets routine eye care,,,,,,, recent bilateral cataract lens implants,,,,, regular dental care, BSE monthly, mammogram set up for tomorrow, colonoscopy 2015 normal  Her only medicine she takes an daily basis as the Maxide. She uses Premarin cream for vaginal dryness and Zyrtec when necessary for allergic rhinitis.  Cognitive function normal she walks on a daily basis home health safety reviewed no issues identified, no guns in the house, she does have a healthcare power of attorney and living well  Vaccinations up-to-date  Last bone density a couple years ago normal therefore not repeated  Last pelvic and Pap was done year ago they were normal. She's never had a GYN problems. All her Paps are normal. She's been in monogamous relationship for 50+ years. Therefore recommend pelvic and Pap every 3 years   Review of Systems  Constitutional: Negative.   HENT: Negative.   Eyes: Negative.   Respiratory: Negative.   Cardiovascular: Negative.   Gastrointestinal: Negative.   Endocrine: Negative.   Genitourinary: Negative.   Musculoskeletal: Negative.   Skin: Negative.   Allergic/Immunologic: Negative.   Neurological: Negative.   Hematological: Negative.   Psychiatric/Behavioral: Negative.        Objective:   Physical Exam  Constitutional: She is oriented to person, place, and time. She appears well-developed and well-nourished.  HENT:  Head: Normocephalic and atraumatic.  Right Ear: External ear normal.  Left Ear: External ear normal.  Nose: Nose normal.  Mouth/Throat: Oropharynx is clear and moist.  Eyes: EOM are normal. Pupils are equal, round, and reactive to light.    Neck: Normal range of motion. Neck supple. No JVD present. No tracheal deviation present. No thyromegaly present.  Cardiovascular: Normal rate, regular rhythm, normal heart sounds and intact distal pulses.  Exam reveals no gallop and no friction rub.   No murmur heard. Pulmonary/Chest: Effort normal and breath sounds normal. No stridor. No respiratory distress. She has no wheezes. She has no rales. She exhibits no tenderness.  Abdominal: Soft. Bowel sounds are normal. She exhibits no distension and no mass. There is no tenderness. There is no rebound and no guarding.  Genitourinary:  No palpable masses bilateral breast implants  Musculoskeletal: Normal range of motion.  Lymphadenopathy:    She has no cervical adenopathy.  Neurological: She is alert and oriented to person, place, and time. She has normal reflexes. No cranial nerve deficit. She exhibits normal muscle tone. Coordination normal.  Skin: Skin is warm and dry. No rash noted. No erythema. No pallor.  Total body skin exam normal except for scar lower pelvis across entire abdomen from previous abdominoplasty  Psychiatric: She has a normal mood and affect. Her behavior is normal. Judgment and thought content normal.  Nursing note and vitals reviewed.         Assessment & Plan:  Healthy female  Hypertension at goal........ continue current therapy  Postmenopausal vaginal dryness......... Premarin when necessary

## 2015-11-14 NOTE — Patient Instructions (Signed)
Continue current medication  Follow-up in one year sooner if any problems.......... Tommi Rumps or [place] [facility] new nurse practitioners or Dr. Martinique

## 2015-11-14 NOTE — Progress Notes (Signed)
Pre visit review using our clinic review tool, if applicable. No additional management support is needed unless otherwise documented below in the visit note. 

## 2015-11-15 ENCOUNTER — Ambulatory Visit
Admission: RE | Admit: 2015-11-15 | Discharge: 2015-11-15 | Disposition: A | Discharge status: Home / Self Care | Payer: Medicare Other | Source: Ambulatory Visit

## 2015-11-15 DIAGNOSIS — Z1231 Encounter for screening mammogram for malignant neoplasm of breast: Secondary | ICD-10-CM

## 2016-01-06 ENCOUNTER — Other Ambulatory Visit: Payer: Self-pay | Admitting: Family Medicine

## 2016-08-16 ENCOUNTER — Ambulatory Visit (INDEPENDENT_AMBULATORY_CARE_PROVIDER_SITE_OTHER): Payer: Medicare Other | Admitting: Emergency Medicine

## 2016-08-16 DIAGNOSIS — Z23 Encounter for immunization: Secondary | ICD-10-CM | POA: Diagnosis not present

## 2016-11-27 ENCOUNTER — Ambulatory Visit (INDEPENDENT_AMBULATORY_CARE_PROVIDER_SITE_OTHER): Payer: Medicare Other | Admitting: Family Medicine

## 2016-11-27 ENCOUNTER — Encounter: Payer: Self-pay | Admitting: Family Medicine

## 2016-11-27 VITALS — BP 110/70 | HR 69 | Temp 97.7°F | Ht 66.0 in | Wt 147.3 lb

## 2016-11-27 DIAGNOSIS — Z0000 Encounter for general adult medical examination without abnormal findings: Secondary | ICD-10-CM

## 2016-11-27 DIAGNOSIS — J301 Allergic rhinitis due to pollen: Secondary | ICD-10-CM | POA: Diagnosis not present

## 2016-11-27 DIAGNOSIS — L259 Unspecified contact dermatitis, unspecified cause: Secondary | ICD-10-CM

## 2016-11-27 DIAGNOSIS — N952 Postmenopausal atrophic vaginitis: Secondary | ICD-10-CM | POA: Diagnosis not present

## 2016-11-27 DIAGNOSIS — I10 Essential (primary) hypertension: Secondary | ICD-10-CM

## 2016-11-27 LAB — CBC WITH DIFFERENTIAL/PLATELET
BASOS ABS: 0 10*3/uL (ref 0.0–0.1)
Basophils Relative: 0.7 % (ref 0.0–3.0)
Eosinophils Absolute: 0.1 10*3/uL (ref 0.0–0.7)
Eosinophils Relative: 3.8 % (ref 0.0–5.0)
HEMATOCRIT: 40 % (ref 36.0–46.0)
Hemoglobin: 13.1 g/dL (ref 12.0–15.0)
LYMPHS ABS: 1.5 10*3/uL (ref 0.7–4.0)
Lymphocytes Relative: 40.7 % (ref 12.0–46.0)
MCHC: 32.8 g/dL (ref 30.0–36.0)
MCV: 91.8 fl (ref 78.0–100.0)
MONOS PCT: 12.9 % — AB (ref 3.0–12.0)
Monocytes Absolute: 0.5 10*3/uL (ref 0.1–1.0)
NEUTROS ABS: 1.6 10*3/uL (ref 1.4–7.7)
NEUTROS PCT: 41.9 % — AB (ref 43.0–77.0)
PLATELETS: 243 10*3/uL (ref 150.0–400.0)
RBC: 4.35 Mil/uL (ref 3.87–5.11)
RDW: 13.3 % (ref 11.5–15.5)
WBC: 3.8 10*3/uL — ABNORMAL LOW (ref 4.0–10.5)

## 2016-11-27 LAB — POCT URINALYSIS DIPSTICK
BILIRUBIN UA: NEGATIVE
GLUCOSE UA: NEGATIVE
KETONES UA: NEGATIVE
Leukocytes, UA: NEGATIVE
Nitrite, UA: NEGATIVE
PH UA: 6
Protein, UA: NEGATIVE
RBC UA: NEGATIVE
SPEC GRAV UA: 1.025
Urobilinogen, UA: 0.2

## 2016-11-27 LAB — BASIC METABOLIC PANEL
BUN: 18 mg/dL (ref 6–23)
CALCIUM: 9.4 mg/dL (ref 8.4–10.5)
CO2: 36 meq/L — AB (ref 19–32)
Chloride: 101 mEq/L (ref 96–112)
Creatinine, Ser: 0.84 mg/dL (ref 0.40–1.20)
GFR: 85.55 mL/min (ref >60.00)
GLUCOSE: 98 mg/dL (ref 70–99)
Potassium: 3.8 mEq/L (ref 3.5–5.1)
Sodium: 140 mEq/L (ref 135–145)

## 2016-11-27 LAB — TSH: TSH: 2.14 u[IU]/mL (ref 0.35–4.50)

## 2016-11-27 MED ORDER — FLUTICASONE PROPIONATE 50 MCG/ACT NA SUSP: 2.0000 | Freq: Every day | NASAL | 11 refills | Status: AC | PRN

## 2016-11-27 MED ORDER — TRIAMTERENE-HCTZ 75-50 MG PO TABS: 1.0000 | ORAL_TABLET | Freq: Every day | ORAL | 4 refills | Status: DC

## 2016-11-27 MED ORDER — ESTROGENS, CONJUGATED 0.625 MG/GM VA CREA: TOPICAL_CREAM | VAGINAL | 11 refills | Status: DC

## 2016-11-27 MED ORDER — FLUOCINONIDE 0.05 % EX CREA: TOPICAL_CREAM | CUTANEOUS | 3 refills | Status: DC

## 2016-11-27 NOTE — Progress Notes (Signed)
[place] is a 71 year old married female nonsmoker who comes in today for general physical evaluation because a history of hypertension  She takes Maxide 75-25 one half tab daily BP 110/70  She uses steroid nasal spray and Zyrtec for allergic rhinitis  He uses the Premarin cream but only when necessary. She's having a lot of trouble with vaginal itching. Advised to use it twice weekly not when necessary  She's used Lidex in the past because of rashes.  She gets routine eye care, dental care, BSE monthly, annual mammography, colonoscopy 2015 normal.  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  Last Pap was January 2016 normal every trouble with her Pap smears in the past. Therefore recommend Paps every 3 years for her.  14 point review of systems reviewed and otherwise negative  BP 110/70 (BP Location: Left Arm, Patient Position: Sitting, Cuff Size: Normal)   Pulse 69   Temp 97.7 F (36.5 C) (Oral)   Ht 5\' 6"  (1.676 m)   Wt 147 lb 4.8 oz (66.8 kg)   BMI 23.77 kg/m  General she is a well-developed well-nourished female no acute distress vital signs stable she's afebrile HEENT were negative neck was supple no adenopathy thyroid normal no carotid bruits cardiopulmonary exam normal breast exam normal except for bilateral implants. Abdominal exam negative except for scar from previous abdominoplasty. Pelvic and rectal deferred extremities normal skin no peripheral pulses normal except for a rash in the pubic area from a contact type dermatitis.  #1 hypertension at goal......... continue current therapy  #2 status post& implants and abdominoplasty,,,, asymptomatic  #3 contact dermatitis refill Lidex  Number for allergic rhinitis continue Zyrtec and steroid nasal spray

## 2016-11-27 NOTE — Patient Instructions (Signed)
Continue current medications  Small amounts of the Lidex twice daily for the skin rash  Premarin vaginal cream............ one applicator full twice weekly  Astroglide......... OTC vaginal lubricant  Follow-up in one year sooner if any problems.

## 2016-11-27 NOTE — Progress Notes (Signed)
Pre visit review using our clinic review tool, if applicable. No additional management support is needed unless otherwise documented below in the visit note. 

## 2017-11-19 ENCOUNTER — Encounter: Payer: Self-pay | Admitting: Family Medicine

## 2017-11-19 ENCOUNTER — Other Ambulatory Visit (HOSPITAL_COMMUNITY)
Admission: RE | Admit: 2017-11-19 | Discharge: 2017-11-19 | Disposition: A | Discharge status: Home / Self Care | Payer: Medicare Other | Source: Ambulatory Visit | Attending: Family Medicine | Admitting: Family Medicine

## 2017-11-19 ENCOUNTER — Ambulatory Visit (INDEPENDENT_AMBULATORY_CARE_PROVIDER_SITE_OTHER): Payer: Medicare Other | Admitting: Family Medicine

## 2017-11-19 VITALS — BP 110/74 | HR 74 | Temp 97.9°F | Ht 65.0 in | Wt 148.0 lb

## 2017-11-19 DIAGNOSIS — N952 Postmenopausal atrophic vaginitis: Secondary | ICD-10-CM

## 2017-11-19 DIAGNOSIS — Z0000 Encounter for general adult medical examination without abnormal findings: Secondary | ICD-10-CM | POA: Diagnosis not present

## 2017-11-19 DIAGNOSIS — Z124 Encounter for screening for malignant neoplasm of cervix: Secondary | ICD-10-CM | POA: Insufficient documentation

## 2017-11-19 DIAGNOSIS — I10 Essential (primary) hypertension: Secondary | ICD-10-CM

## 2017-11-19 LAB — BASIC METABOLIC PANEL
BUN: 23 mg/dL (ref 6–23)
CO2: 36 mEq/L — ABNORMAL HIGH (ref 19–32)
CREATININE: 0.8 mg/dL (ref 0.40–1.20)
Calcium: 9.5 mg/dL (ref 8.4–10.5)
Chloride: 101 mEq/L (ref 96–112)
GFR: 90.26 mL/min (ref >60.00)
Glucose, Bld: 89 mg/dL (ref 70–99)
Potassium: 4.1 mEq/L (ref 3.5–5.1)
Sodium: 142 mEq/L (ref 135–145)

## 2017-11-19 LAB — CBC WITH DIFFERENTIAL/PLATELET
BASOS PCT: 0.7 % (ref 0.0–3.0)
Basophils Absolute: 0 10*3/uL (ref 0.0–0.1)
EOS PCT: 4.3 % (ref 0.0–5.0)
Eosinophils Absolute: 0.2 10*3/uL (ref 0.0–0.7)
HCT: 39.9 % (ref 36.0–46.0)
Hemoglobin: 13.2 g/dL (ref 12.0–15.0)
LYMPHS ABS: 1.9 10*3/uL (ref 0.7–4.0)
Lymphocytes Relative: 44.1 % (ref 12.0–46.0)
MCHC: 33 g/dL (ref 30.0–36.0)
MCV: 90.4 fl (ref 78.0–100.0)
MONOS PCT: 13.2 % — AB (ref 3.0–12.0)
Monocytes Absolute: 0.6 10*3/uL (ref 0.1–1.0)
NEUTROS ABS: 1.6 10*3/uL (ref 1.4–7.7)
NEUTROS PCT: 37.7 % — AB (ref 43.0–77.0)
PLATELETS: 245 10*3/uL (ref 150.0–400.0)
RBC: 4.41 Mil/uL (ref 3.87–5.11)
RDW: 13.6 % (ref 11.5–15.5)
WBC: 4.2 10*3/uL (ref 4.0–10.5)

## 2017-11-19 LAB — POCT URINALYSIS DIPSTICK
BILIRUBIN UA: NEGATIVE
Blood, UA: NEGATIVE
GLUCOSE UA: NEGATIVE
KETONES UA: NEGATIVE
Leukocytes, UA: NEGATIVE
Nitrite, UA: NEGATIVE
ODOR: NEGATIVE
PH UA: 6 (ref 5.0–8.0)
Spec Grav, UA: >=1.03 — AB (ref 1.010–1.025)
UROBILINOGEN UA: 0.2 U/dL

## 2017-11-19 LAB — LIPID PANEL
CHOL/HDL RATIO: 4
Cholesterol: 233 mg/dL — ABNORMAL HIGH (ref 0–200)
HDL: 66.2 mg/dL (ref >39.00)
LDL Cholesterol: 154 mg/dL — ABNORMAL HIGH (ref 0–99)
NONHDL: 166.89
Triglycerides: 63 mg/dL (ref 0.0–149.0)
VLDL: 12.6 mg/dL (ref 0.0–40.0)

## 2017-11-19 LAB — HEPATIC FUNCTION PANEL
ALBUMIN: 4.4 g/dL (ref 3.5–5.2)
ALT: 16 U/L (ref 0–35)
AST: 16 U/L (ref 0–37)
Alkaline Phosphatase: 66 U/L (ref 39–117)
BILIRUBIN TOTAL: 0.6 mg/dL (ref 0.2–1.2)
Bilirubin, Direct: 0.1 mg/dL (ref 0.0–0.3)
Total Protein: 7.3 g/dL (ref 6.0–8.3)

## 2017-11-19 LAB — TSH: TSH: 3.94 u[IU]/mL (ref 0.35–4.50)

## 2017-11-19 MED ORDER — FLUOCINONIDE 0.05 % EX CREA: TOPICAL_CREAM | CUTANEOUS | 3 refills | Status: DC

## 2017-11-19 MED ORDER — ESTROGENS, CONJUGATED 0.625 MG/GM VA CREA: TOPICAL_CREAM | VAGINAL | 11 refills | Status: DC

## 2017-11-19 MED ORDER — TRIAMTERENE-HCTZ 75-50 MG PO TABS: ORAL_TABLET | ORAL | 4 refills | Status: DC

## 2017-11-19 NOTE — Progress Notes (Signed)
a[place] is a delightful 72 year old married female nonsmoker G1 P1 who comes in today for annual physical examination because of a history of hypertension She has a history of hypertension well controlled with Maxide 7550 dose one half tab daily. BP today 110/74  She gets routine eye care, dental care, BSE monthly but forgot to get her mammogram last year. His eyes to get annual mammography. Colonoscopy 2015 normal  Vaccinations up-to-date information given on shingles.  Uses Zyrtec OTC for allergic rhinitis. She is  She uses Premarin cream once weekly because of vaginal dryness  She uses Lidex when necessary for eczema and Flonase when necessary for allergic rhinitis.  Social history...........Marland Kitchen Married lives here in [place]. One daughter went to [employer]. She and her husband enjoy traveling and daily exercise  Cognitive function normal she exercises on a daily basis home health safety reviewed no issues identified, no guns in the house, she does have a healthcare power of attorney and living well  14 point review of systems June otherwise negative  BP 110/74 (BP Location: Left Arm, Patient Position: Sitting, Cuff Size: Normal)   Pulse 74   Temp 97.9 F (36.6 C) (Oral)   Ht 5\' 5"  (1.651 m)   Wt 148 lb (67.1 kg)   BMI 24.63 kg/m  Examination HEENT were negative neck was supple thyroid is not large cardiopulmonary exam normal breast exam normal abdominal exam normal except for scar right lower midline across entire abdomen from previous abdominoplasty. Pelvic examination external genitalia within normal limits vaginal vault was normal cervix is visualized. She does have a cervical polyp. Pap smear is done bimanual exam negative .  Rectal exam normal stool guaiac-negative  Extremities normal skin normal peripheral pulses normal  #1 healthy female  #2 hypertension ago...Marland KitchenMarland KitchenMarland Kitchen Continue current therapy check labs  #3 allergic rhinitis........ Continue OTC  meds  . Postmenopausal vaginal dryness.......... Continue hormonal cream

## 2017-11-19 NOTE — Patient Instructions (Signed)
Labs today.......ibuprofen will call if is anything abnormal  Continue current medications  You can get the Premarin cream............. [email or web address] or the cheapest place in [employer]

## 2017-11-20 LAB — CYTOLOGY - PAP: Diagnosis: NEGATIVE

## 2017-12-15 ENCOUNTER — Other Ambulatory Visit: Payer: Self-pay | Admitting: Family Medicine

## 2017-12-26 ENCOUNTER — Other Ambulatory Visit: Payer: Self-pay | Admitting: Family Medicine

## 2017-12-26 DIAGNOSIS — Z1231 Encounter for screening mammogram for malignant neoplasm of breast: Secondary | ICD-10-CM

## 2018-01-14 ENCOUNTER — Ambulatory Visit
Admission: RE | Admit: 2018-01-14 | Discharge: 2018-01-14 | Disposition: A | Discharge status: Home / Self Care | Payer: Medicare Other | Source: Ambulatory Visit | Attending: Family Medicine | Admitting: Family Medicine

## 2018-01-14 DIAGNOSIS — Z1231 Encounter for screening mammogram for malignant neoplasm of breast: Secondary | ICD-10-CM

## 2018-11-19 ENCOUNTER — Telehealth: Payer: Self-pay

## 2018-11-19 NOTE — Telephone Encounter (Signed)
Author attempted X2, no answer. Will try to connect with pt. Tomorrow after 10AM visit with Dr. Volanda Napoleon.

## 2018-11-19 NOTE — Telephone Encounter (Signed)
Author phoned pt. to offer scheduling an initial AWV prior to [facility] appointment with Dr. Volanda Napoleon. No answer; author left detailed VM asking for return call.

## 2018-11-20 ENCOUNTER — Ambulatory Visit (INDEPENDENT_AMBULATORY_CARE_PROVIDER_SITE_OTHER): Payer: Medicare Other | Admitting: Family Medicine

## 2018-11-20 ENCOUNTER — Telehealth: Payer: Self-pay

## 2018-11-20 ENCOUNTER — Encounter: Payer: Self-pay | Admitting: Family Medicine

## 2018-11-20 VITALS — BP 108/68 | HR 68 | Temp 98.1°F | Ht 65.0 in | Wt 146.0 lb

## 2018-11-20 DIAGNOSIS — Z23 Encounter for immunization: Secondary | ICD-10-CM | POA: Diagnosis not present

## 2018-11-20 DIAGNOSIS — Z1159 Encounter for screening for other viral diseases: Secondary | ICD-10-CM

## 2018-11-20 DIAGNOSIS — H6123 Impacted cerumen, bilateral: Secondary | ICD-10-CM | POA: Diagnosis not present

## 2018-11-20 DIAGNOSIS — Z0000 Encounter for general adult medical examination without abnormal findings: Secondary | ICD-10-CM

## 2018-11-20 DIAGNOSIS — I10 Essential (primary) hypertension: Secondary | ICD-10-CM

## 2018-11-20 DIAGNOSIS — E782 Mixed hyperlipidemia: Secondary | ICD-10-CM | POA: Diagnosis not present

## 2018-11-20 DIAGNOSIS — Z0001 Encounter for general adult medical examination with abnormal findings: Secondary | ICD-10-CM

## 2018-11-20 LAB — CBC WITH DIFFERENTIAL/PLATELET
BASOS PCT: 0.8 % (ref 0.0–3.0)
Basophils Absolute: 0 10*3/uL (ref 0.0–0.1)
EOS PCT: 3.9 % (ref 0.0–5.0)
Eosinophils Absolute: 0.1 10*3/uL (ref 0.0–0.7)
HCT: 42.9 % (ref 36.0–46.0)
Hemoglobin: 14 g/dL (ref 12.0–15.0)
LYMPHS ABS: 1.6 10*3/uL (ref 0.7–4.0)
Lymphocytes Relative: 44.3 % (ref 12.0–46.0)
MCHC: 32.7 g/dL (ref 30.0–36.0)
MCV: 91.2 fl (ref 78.0–100.0)
MONO ABS: 0.5 10*3/uL (ref 0.1–1.0)
MONOS PCT: 13.2 % — AB (ref 3.0–12.0)
NEUTROS ABS: 1.4 10*3/uL (ref 1.4–7.7)
NEUTROS PCT: 37.8 % — AB (ref 43.0–77.0)
PLATELETS: 239 10*3/uL (ref 150.0–400.0)
RBC: 4.71 Mil/uL (ref 3.87–5.11)
RDW: 13.4 % (ref 11.5–15.5)
WBC: 3.7 10*3/uL — ABNORMAL LOW (ref 4.0–10.5)

## 2018-11-20 LAB — BASIC METABOLIC PANEL
BUN: 15 mg/dL (ref 6–23)
CALCIUM: 9.7 mg/dL (ref 8.4–10.5)
CO2: 34 meq/L — AB (ref 19–32)
CREATININE: 0.75 mg/dL (ref 0.40–1.20)
Chloride: 101 mEq/L (ref 96–112)
GFR: 91.24 mL/min (ref >60.00)
Glucose, Bld: 87 mg/dL (ref 70–99)
Potassium: 4.4 mEq/L (ref 3.5–5.1)
SODIUM: 140 meq/L (ref 135–145)

## 2018-11-20 LAB — LIPID PANEL
CHOL/HDL RATIO: 4
CHOLESTEROL: 231 mg/dL — AB (ref 0–200)
HDL: 66.1 mg/dL (ref >39.00)
LDL CALC: 151 mg/dL — AB (ref 0–99)
NONHDL: 165.38
TRIGLYCERIDES: 72 mg/dL (ref 0.0–149.0)
VLDL: 14.4 mg/dL (ref 0.0–40.0)

## 2018-11-20 NOTE — Progress Notes (Signed)
Subjective:     Wendy Salazar is a 73 y.o. female and is here for a comprehensive physical exam. Pt previously seen by Dr. Sherren Mocha.  The patient reports no problems.  Pt states she does not have any health issues.  She takes Triamteren-HCTZ 75-50 mg 1/2 pill daily for BP.  Pt does not check bp at home.  Pt states overall she eats healthy.  She does not eat fried foods.  Every now and then has a fried plantain.  Pt is from [place] area of [place].   Social History   Socioeconomic History  . Marital status: Married    Spouse name: Not on file  . Number of children: Not on file  . Years of education: Not on file  . Highest education level: Not on file  Occupational History  . Not on file  Social Needs  . Financial resource strain: Not on file  . Food insecurity:    Worry: Not on file    Inability: Not on file  . Transportation needs:    Medical: Not on file    Non-medical: Not on file  Tobacco Use  . Smoking status: Never Smoker  . Smokeless tobacco: Never Used  Substance and Sexual Activity  . Alcohol use: Yes    Alcohol/week: 1.0 standard drinks    Types: 1 drink(s) per week  . Drug use: No  . Sexual activity: Not on file  Lifestyle  . Physical activity:    Days per week: Not on file    Minutes per session: Not on file  . Stress: Not on file  Relationships  . Social connections:    Talks on phone: Not on file    Gets together: Not on file    Attends religious service: Not on file    Active member of club or organization: Not on file    Attends meetings of clubs or organizations: Not on file    Relationship status: Not on file  . Intimate partner violence:    Fear of current or ex partner: Not on file    Emotionally abused: Not on file    Physically abused: Not on file    Forced sexual activity: Not on file  Other Topics Concern  . Not on file  Social History Narrative  . Not on file   Health Maintenance  Topic Date Due  . Hepatitis C Screening  18-May-1946  .  DEXA SCAN  03/25/2009  . INFLUENZA VACCINE  05/22/2018  . TETANUS/TDAP  08/06/2018  . MAMMOGRAM  01/15/2020  . COLONOSCOPY  11/19/2023  . PNA vac Low Risk Adult  Completed    The following portions of the patient's history were reviewed and updated as appropriate: allergies, current medications, past family history, past medical history, past social history, past surgical history and problem list.  Review of Systems Pertinent items noted in HPI and remainder of comprehensive ROS otherwise negative.   Objective:    BP 108/68 (BP Location: Left Arm, Patient Position: Sitting, Cuff Size: Normal)   Pulse 68   Temp 98.1 F (36.7 C) (Oral)   Ht 5\' 5"  (1.651 m)   Wt 146 lb (66.2 kg)   SpO2 98%   BMI 24.30 kg/m  General appearance: alert, cooperative and no distress Head: Normocephalic, without obvious abnormality, atraumatic Eyes: conjunctivae/corneas clear. PERRL, EOM's intact. Fundi benign. Ears: normal TM's and external ear canals both ears Nose: Nares normal. Septum midline. Mucosa normal. No drainage or sinus tenderness. Throat: lips, mucosa,  and tongue normal; teeth and gums normal Neck: no adenopathy, no carotid bruit, no JVD, supple, symmetrical, trachea midline and thyroid not enlarged, symmetric, no tenderness/mass/nodules Lungs: clear to auscultation bilaterally Heart: regular rate and rhythm, S1, S2 normal, no murmur, click, rub or gallop Abdomen: soft, non-tender; bowel sounds normal; no masses,  no organomegaly Extremities: extremities normal, atraumatic, no cyanosis or edema Skin: Skin color, texture, turgor normal. No rashes or lesions Neurologic: Alert and oriented X 3, normal strength and tone. Normal symmetric reflexes. Normal coordination and gait    Assessment:    Healthy female exam.      Plan:     Anticipatory guidance given including wearing seatbelts, smoke detectors in the home, increasing physical activity, increasing p.o. intake of water and  vegetables. -will obtain labs -tdap given this visit.  Other immunizations up to date -given handout -next CPE in 1 yr See After Visit Summary for Counseling Recommendations    HTN -continue triameterene-HCTZ 75-50 take 1/2 pill daily -lifestyle modifications encouraged  HLD -lipid panel -lifestyle modifications  Cerumen impaction -consent obtained.  B/l ears irrigated.  Pt tolerated procedure well. -given handout  F/u prn  Grier Mitts, MD

## 2018-11-20 NOTE — Telephone Encounter (Signed)
Author spoke with pt. Regarding medicare wellness visit, and pt. Declined at this time. Pt. Stated, "I know about them, but I think I'm good for now".

## 2018-11-20 NOTE — Patient Instructions (Signed)
Preventive Care 75 Years and Older, Female Preventive care refers to lifestyle choices and visits with your health care provider that can promote health and wellness. What does preventive care include?  A yearly physical exam. This is also called an annual well check.  Dental exams once or twice a year.  Routine eye exams. Ask your health care provider how often you should have your eyes checked.  Personal lifestyle choices, including: ? Daily care of your teeth and gums. ? Regular physical activity. ? Eating a healthy diet. ? Avoiding tobacco and drug use. ? Limiting alcohol use. ? Practicing safe sex. ? Taking low-dose aspirin every day. ? Taking vitamin and mineral supplements as recommended by your health care provider. What happens during an annual well check? The services and screenings done by your health care provider during your annual well check will depend on your age, overall health, lifestyle risk factors, and family history of disease. Counseling Your health care provider may ask you questions about your:  Alcohol use.  Tobacco use.  Drug use.  Emotional well-being.  Home and relationship well-being.  Sexual activity.  Eating habits.  History of falls.  Memory and ability to understand (cognition).  Work and work Statistician.  Reproductive health.  Screening You may have the following tests or measurements:  Height, weight, and BMI.  Blood pressure.  Lipid and cholesterol levels. These may be checked every 5 years, or more frequently if you are over 75 years old.  Skin check.  Lung cancer screening. You may have this screening every year starting at age 75 if you have a 30-pack-year history of smoking and currently smoke or have quit within the past 15 years.  Colorectal cancer screening. All adults should have this screening starting at age 75 and continuing until age 75. You will have tests every 1-10 years, depending on your results and the  type of screening test. People at increased risk should start screening at an earlier age. Screening tests may include: ? Guaiac-based fecal occult blood testing. ? Fecal immunochemical test (FIT). ? Stool DNA test. ? Virtual colonoscopy. ? Sigmoidoscopy. During this test, a flexible tube with a tiny camera (sigmoidoscope) is used to examine your rectum and lower colon. The sigmoidoscope is inserted through your anus into your rectum and lower colon. ? Colonoscopy. During this test, a long, thin, flexible tube with a tiny camera (colonoscope) is used to examine your entire colon and rectum.  Hepatitis C blood test.  Hepatitis B blood test.  Sexually transmitted disease (STD) testing.  Diabetes screening. This is done by checking your blood sugar (glucose) after you have not eaten for a while (fasting). You may have this done every 1-3 years.  Bone density scan. This is done to screen for osteoporosis. You may have this done starting at age 75.  Mammogram. This may be done every 1-2 years. Talk to your health care provider about how often you should have regular mammograms. Talk with your health care provider about your test results, treatment options, and if necessary, the need for more tests. Vaccines Your health care provider may recommend certain vaccines, such as:  Influenza vaccine. This is recommended every year.  Tetanus, diphtheria, and acellular pertussis (Tdap, Td) vaccine. You may need a Td booster every 10 years.  Varicella vaccine. You may need this if you have not been vaccinated.  Zoster vaccine. You may need this after age 75.  Measles, mumps, and rubella (MMR) vaccine. You may need at least  one dose of MMR if you were born in 1957 or later. You may also need a second dose.  Pneumococcal 13-valent conjugate (PCV13) vaccine. One dose is recommended after age 75.  Pneumococcal polysaccharide (PPSV23) vaccine. One dose is recommended after age 7.  Meningococcal  vaccine. You may need this if you have certain conditions.  Hepatitis A vaccine. You may need this if you have certain conditions or if you travel or work in places where you may be exposed to hepatitis A.  Hepatitis B vaccine. You may need this if you have certain conditions or if you travel or work in places where you may be exposed to hepatitis B.  Haemophilus influenzae type b (Hib) vaccine. You may need this if you have certain conditions. Talk to your health care provider about which screenings and vaccines you need and how often you need them. This information is not intended to replace advice given to you by your health care provider. Make sure you discuss any questions you have with your health care provider. Document Released: 11/04/2015 Document Revised: 11/28/2017 Document Reviewed: 08/09/2015 Elsevier Interactive Patient Education  2019 [place], Adult The ears produce a substance called earwax that helps keep bacteria out of the ear and protects the skin in the ear canal. Occasionally, earwax can build up in the ear and cause discomfort or hearing loss. What increases the risk? This condition is more likely to develop in people who:  Are female.  Are elderly.  Naturally produce more earwax.  Clean their ears often with cotton swabs.  Use earplugs often.  Use in-ear headphones often.  Wear hearing aids.  Have narrow ear canals.  Have earwax that is overly thick or sticky.  Have eczema.  Are dehydrated.  Have excess hair in the ear canal. What are the signs or symptoms? Symptoms of this condition include:  Reduced or muffled hearing.  A feeling of fullness in the ear or feeling that the ear is plugged.  Fluid coming from the ear.  Ear pain.  Ear itch.  Ringing in the ear.  Coughing.  An obvious piece of earwax that can be seen inside the ear canal. How is this diagnosed? This condition may be diagnosed based on:  Your  symptoms.  Your medical history.  An ear exam. During the exam, your health care provider will look into your ear with an instrument called an otoscope. You may have tests, including a hearing test. How is this treated? This condition may be treated by:  Using ear drops to soften the earwax.  Having the earwax removed by a health care provider. The health care provider may: ? Flush the ear with water. ? Use an instrument that has a loop on the end (curette). ? Use a suction device.  Surgery to remove the wax buildup. This may be done in severe cases. Follow these instructions at home:   Take over-the-counter and prescription medicines only as told by your health care provider.  Do not put any objects, including cotton swabs, into your ear. You can clean the opening of your ear canal with a washcloth or facial tissue.  Follow instructions from your health care provider about cleaning your ears. Do not over-clean your ears.  Drink enough fluid to keep your urine clear or pale yellow. This will help to thin the earwax.  Keep all follow-up visits as told by your health care provider. If earwax builds up in your ears often or if you  use hearing aids, consider seeing your health care provider for routine, preventive ear cleanings. Ask your health care provider how often you should schedule your cleanings.  If you have hearing aids, clean them according to instructions from the manufacturer and your health care provider. Contact a health care provider if:  You have ear pain.  You develop a fever.  You have blood, pus, or other fluid coming from your ear.  You have hearing loss.  You have ringing in your ears that does not go away.  Your symptoms do not improve with treatment.  You feel like the room is spinning (vertigo). Summary  Earwax can build up in the ear and cause discomfort or hearing loss.  The most common symptoms of this condition include reduced or muffled  hearing and a feeling of fullness in the ear or feeling that the ear is plugged.  This condition may be diagnosed based on your symptoms, your medical history, and an ear exam.  This condition may be treated by using ear drops to soften the earwax or by having the earwax removed by a health care provider.  Do not put any objects, including cotton swabs, into your ear. You can clean the opening of your ear canal with a washcloth or facial tissue. This information is not intended to replace advice given to you by your health care provider. Make sure you discuss any questions you have with your health care provider. Document Released: 11/15/2004 Document Revised: 09/19/2017 Document Reviewed: 12/19/2016 Elsevier Interactive Patient Education  2019 [employer].

## 2018-11-21 LAB — HEPATITIS C ANTIBODY
Hepatitis C Ab: NONREACTIVE
SIGNAL TO CUT-OFF: 0.02 (ref <1.00)

## 2019-07-02 ENCOUNTER — Telehealth: Payer: Self-pay | Admitting: Family Medicine

## 2019-07-02 ENCOUNTER — Other Ambulatory Visit: Payer: Self-pay

## 2019-07-02 MED ORDER — TRIAMTERENE-HCTZ 75-50 MG PO TABS: ORAL_TABLET | ORAL | 1 refills | Status: DC

## 2019-07-02 NOTE — Telephone Encounter (Signed)
RX REFILL triamterene-hydrochlorothiazide (MAXZIDE) 75-50 MG tablet  PHARMACY   [id number] [place], [place] - [place] (Phone) [phone] (Fax)

## 2019-07-02 NOTE — Telephone Encounter (Signed)
Rx sent 

## 2019-07-06 ENCOUNTER — Telehealth: Payer: Self-pay | Admitting: *Deleted

## 2019-07-06 NOTE — Telephone Encounter (Signed)
Copied from [place] [id number]. Topic: Complaint - Staff >> Jul 02, 2019  9:37 AM Mathis Bud wrote: Date of Incident: 11-20-2018 Details of complaint: Patient had CPE with [facility].  Patient states she was not satisfied with Volanda Napoleon, She felt she should have done more and spent more time with her due to her age.   How would the patient like to see it resolved? Patient would like a different provider at the office or she would like to leave [place].  Patient would like to TOC to [place].  Route to Engineer, building services.

## 2019-07-07 NOTE — Telephone Encounter (Signed)
Ok. It may be beneficial to explain to pt that she had her AWV with the nurse.

## 2019-07-08 NOTE — Telephone Encounter (Signed)
Ok

## 2019-07-08 NOTE — Telephone Encounter (Signed)
Attempted to call patient to schedule a TOC appointment, but unable to leave a message

## 2019-08-31 ENCOUNTER — Other Ambulatory Visit: Payer: Self-pay | Admitting: Family Medicine

## 2019-08-31 DIAGNOSIS — Z1231 Encounter for screening mammogram for malignant neoplasm of breast: Secondary | ICD-10-CM

## 2019-09-09 ENCOUNTER — Encounter: Payer: Self-pay | Admitting: Internal Medicine

## 2019-09-09 ENCOUNTER — Other Ambulatory Visit: Payer: Self-pay

## 2019-09-09 ENCOUNTER — Ambulatory Visit (INDEPENDENT_AMBULATORY_CARE_PROVIDER_SITE_OTHER): Payer: Medicare Other | Admitting: Internal Medicine

## 2019-09-09 VITALS — BP 110/80 | HR 77 | Temp 97.2°F | Wt 147.5 lb

## 2019-09-09 DIAGNOSIS — M25512 Pain in left shoulder: Secondary | ICD-10-CM | POA: Diagnosis not present

## 2019-09-09 DIAGNOSIS — G8929 Other chronic pain: Secondary | ICD-10-CM

## 2019-09-09 NOTE — Progress Notes (Signed)
Acute Office Visit     CC/Reason for Visit: Left arm pain  HPI: Wendy Salazar is a 73 y.o. female who is coming in today for the above mentioned reasons. Back in the spring she lost her balance and quickly grabbed onto a door handle with her left hand to try and break her fall, but nonetheless ended up falling unto her right side. She has had pain in her left shoulder since. She describes it as a "deep pain", difficult to turn steering wheel or put her seatbelt on. Has had to hire help to clean her house as she is unable to. Sometimes will have stiffness and numbness of her left hand.  Past Medical/Surgical History: Past Medical History:  Diagnosis Date  . Allergic eye reaction   . Allergic rhinitis   . Asthma   . Hair loss   . HTN (hypertension)   . PMS (premenstrual syndrome)   . Rectal tenesmus   . Urinary incontinence 2005    Past Surgical History:  Procedure Laterality Date  . ABDOMINAL SURGERY  2006   Tummy Tuck  . AUGMENTATION MAMMAPLASTY Bilateral 2007  . BREAST ENHANCEMENT SURGERY  2006   Saline implants  . CATARACT EXTRACTION Left 10/28/2013  . LIPOSUCTION  2006  . OTHER SURGICAL HISTORY     CB X3  . TUBAL LIGATION  1985    Social History:  reports that she has never smoked. She has never used smokeless tobacco. She reports current alcohol use of about 1.0 standard drinks of alcohol per week. She reports that she does not use drugs.  Allergies: Allergies  Allergen Reactions  . Penicillins     REACTION: hives    Family History:  Family History  Problem Relation Age of Onset  . Alcohol abuse Unknown   . Diabetes Unknown   . Hypertension Unknown   . Prostate cancer Unknown   . Colon cancer Neg Hx      Current Outpatient Medications:  .  Calcium-Vitamin D-Vitamin K (CALCIUM + D + K PO), Take by mouth daily.  , Disp: , Rfl:  .  cetirizine (ZYRTEC) 10 MG tablet, Take 10 mg by mouth daily as needed.  , Disp: , Rfl:  .  conjugated estrogens  (PREMARIN) vaginal cream, Place vaginally once a week., Disp: 42.5 g, Rfl: 11 .  Docusate Calcium (STOOL SOFTENER PO), Take by mouth as needed., Disp: , Rfl:  .  fluocinonide cream (LIDEX) 0.05 %, APPLY ONE APPLICATION TOPICALLY TWO TIMES DAILY AS NEEDED, Disp: 60 g, Rfl: 3 .  fluticasone (FLONASE) 50 MCG/ACT nasal spray, Place 2 sprays into both nostrils daily as needed., Disp: 32 g, Rfl: 11 .  Hyprom-Naphaz-Polysorb-Zn Sulf (CLEAR EYES COMPLETE OP), Apply to eye., Disp: , Rfl:  .  MELATONIN ER PO, Take by mouth as needed., Disp: , Rfl:  .  triamterene-hydrochlorothiazide (MAXZIDE) 75-50 MG tablet, One half tab every morning, Disp: 100 tablet, Rfl: 1  Review of Systems:  Constitutional: Denies fever, chills, diaphoresis, appetite change and fatigue.  HEENT: Denies photophobia, eye pain, redness, hearing loss, ear pain, congestion, sore throat, rhinorrhea, sneezing, mouth sores, trouble swallowing, neck pain, neck stiffness and tinnitus.   Respiratory: Denies SOB, DOE, cough, chest tightness,  and wheezing.   Cardiovascular: Denies chest pain, palpitations and leg swelling.  Gastrointestinal: Denies nausea, vomiting, abdominal pain, diarrhea, constipation, blood in stool and abdominal distention.  Genitourinary: Denies dysuria, urgency, frequency, hematuria, flank pain and difficulty urinating.  Endocrine: Denies: hot  or cold intolerance, sweats, changes in hair or nails, polyuria, polydipsia. Musculoskeletal: Denies myalgias, back pain, joint swelling, and gait problem.  Skin: Denies pallor, rash and wound.  Neurological: Denies dizziness, seizures, syncope, weakness, light-headedness and headaches.  Hematological: Denies adenopathy. Easy bruising, personal or family bleeding history  Psychiatric/Behavioral: Denies suicidal ideation, mood changes, confusion, nervousness, sleep disturbance and agitation    Physical Exam: Vitals:   09/09/19 1039  BP: 110/80  Pulse: 77  Temp: (!) 97.2 F  (36.2 C)  TempSrc: Temporal  SpO2: 99%  Weight: 147 lb 8 oz (66.9 kg)    Body mass index is 24.55 kg/m.   Constitutional: NAD, calm, comfortable Eyes: PERRL, lids and conjunctivae normal ENMT: Mucous membranes are moist.  Musculoskeletal: Left shoulder: unable to abduct or do a frontal raise beyond 90 degrees, not painful to palpation, Full ROM of elbow and wrist. Psychiatric: Normal judgment and insight. Alert and oriented x 3. Normal mood.    Impression and Plan:  Chronic left shoulder pain  -Concerned for rotator cuff problem given difficulty with ROM and pain. - Plan: MR Shoulder Left Wo Contrast. Don't think an Xray will be of use today. -Follow up after results.     Lelon Frohlich, MD [facility]

## 2019-10-03 ENCOUNTER — Ambulatory Visit
Admission: RE | Admit: 2019-10-03 | Discharge: 2019-10-03 | Disposition: A | Discharge status: Home / Self Care | Payer: Medicare Other | Source: Ambulatory Visit | Attending: Internal Medicine | Admitting: Internal Medicine

## 2019-10-03 ENCOUNTER — Other Ambulatory Visit: Payer: Self-pay

## 2019-10-03 DIAGNOSIS — M25512 Pain in left shoulder: Secondary | ICD-10-CM

## 2019-10-13 ENCOUNTER — Other Ambulatory Visit: Payer: Self-pay | Admitting: *Deleted

## 2019-10-13 ENCOUNTER — Telehealth: Payer: Self-pay | Admitting: Internal Medicine

## 2019-10-13 DIAGNOSIS — M75102 Unspecified rotator cuff tear or rupture of left shoulder, not specified as traumatic: Secondary | ICD-10-CM

## 2019-10-13 DIAGNOSIS — M7552 Bursitis of left shoulder: Secondary | ICD-10-CM

## 2019-10-13 NOTE — Telephone Encounter (Signed)
Patient returning call for MRI results. Patient requesting call back.

## 2019-10-21 ENCOUNTER — Ambulatory Visit: Payer: Medicare Other

## 2019-10-29 ENCOUNTER — Ambulatory Visit (INDEPENDENT_AMBULATORY_CARE_PROVIDER_SITE_OTHER): Payer: Medicare PPO | Admitting: Orthopaedic Surgery

## 2019-10-29 ENCOUNTER — Other Ambulatory Visit: Payer: Self-pay

## 2019-10-29 ENCOUNTER — Ambulatory Visit: Payer: Self-pay

## 2019-10-29 ENCOUNTER — Encounter: Payer: Self-pay | Admitting: Orthopaedic Surgery

## 2019-10-29 DIAGNOSIS — M79644 Pain in right finger(s): Secondary | ICD-10-CM

## 2019-10-29 DIAGNOSIS — M25512 Pain in left shoulder: Secondary | ICD-10-CM | POA: Diagnosis not present

## 2019-10-29 DIAGNOSIS — G8929 Other chronic pain: Secondary | ICD-10-CM | POA: Diagnosis not present

## 2019-10-29 MED ORDER — BUPIVACAINE HCL 0.25 % IJ SOLN
2.0000 mL | INTRAMUSCULAR | Status: AC | PRN
  Administered 2019-10-29: 10:00:00 2 mL via INTRA_ARTICULAR

## 2019-10-29 MED ORDER — METHYLPREDNISOLONE ACETATE 40 MG/ML IJ SUSP
40.0000 mg | INTRAMUSCULAR | Status: AC | PRN
  Administered 2019-10-29: 40 mg via INTRA_ARTICULAR

## 2019-10-29 MED ORDER — LIDOCAINE HCL 2 % IJ SOLN
2.0000 mL | INTRAMUSCULAR | Status: AC | PRN
  Administered 2019-10-29: 10:00:00 2 mL

## 2019-10-29 NOTE — Progress Notes (Signed)
Office Visit Note   Patient: Wendy Salazar           Date of Birth: January 08, 1946           MRN: [id number] Visit Date: 10/29/2019              Requested by: Isaac Bliss, Rayford Halsted, MD [place],  [place] [id number] PCP: Isaac Bliss, Rayford Halsted, MD   Assessment & Plan: Visit Diagnoses:  1. Chronic left shoulder pain   2. 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  #2 right hand sprain.  In regards to the left shoulder, we will proceed with subacromial cortisone injection today.  We will also start her in formal physical therapy.  She continues to have decreased range of motion will likely have her follow-up with Dr. Junius Roads for glenohumeral cortisone injection.  Follow-up with [place] in 6 weeks for recheck.  Regards to her hand, believe this is just a sprain.  No fracture seen on x-ray.  She will increase activity as tolerated.  Call with concerns or questions in meantime.  Follow-Up Instructions: Return in about 6 weeks (around 12/10/2019).   Orders:  Orders Placed This Encounter  Procedures  . Large Joint Inj: L subacromial bursa  . XR Finger Little Right  . XR Hand Complete Right  . Ambulatory referral to Physical Therapy   No orders of the defined types were placed in this encounter.     Procedures: Large Joint Inj: L subacromial bursa on 10/29/2019 9:50 AM Indications: pain Details: 22 G needle Medications: 2 mL lidocaine 2 %; 2 mL bupivacaine 0.25 %; 40 mg methylPREDNISolone acetate 40 MG/ML Outcome: tolerated well, no immediate complications Patient was prepped and draped in the usual sterile fashion.       Clinical Data: No additional findings.   Subjective: Chief Complaint  Patient presents with  . Left Shoulder - Pain  . Right Little Finger - Pain    HPI is a pleasant 74 year old female who comes in today for evaluation and treatment recommendation an  injury to her left shoulder.  She fell a little over a year ago but has had persistent pain since.  Pain she has is to the entire shoulder and radiates into the deltoid.  She does note that her left upper extremity fatigues fairly easily.  She has pain that is worse with internal rotation and abduction.  She has taken Tylenol without significant relief of symptoms.  She denies any numbness, tingling or burning.  She has not been to physical therapy nor had cortisone injections to the left shoulder.  She was seen by her PCP recently where an MRI of the left shoulder was ordered.  MRI showed a partial-thickness bursal surface tear to the supraspinatus.  Moderate bursitis.  Other issue she brings up today is her right hand.  She fell about a month ago landing on her right hand.  She has had pain along the fifth metacarpal and fifth phalanx since.  She has increased pain with movement of the small finger.  No previous x-rays.  Review of Systems as detailed in HPI.  All others reviewed and are negative.   Objective: Vital Signs: There were no vitals taken for this visit.  Physical Exam well-developed well-nourished female no acute distress.  Alert and oriented x3.  Ortho Exam examination of the left shoulder reveals 50% active range of motion.  She can internally rotate to the back pocket.  Positive empty can.  4 out of 5 strength throughout..  She is neurovascularly intact distally.  Right hand exam shows no swelling.  She does have moderate tenderness along the fifth metacarpal and into the proximal fifth phalanx.  She is neurovascular intact distally.  Specialty Comments:  No specialty comments available.  Imaging: XR Hand Complete Right  Result Date: 10/29/2019 No acute or structural abnormalities    PMFS History: Patient Active Problem List   Diagnosis Date Noted  . Screening for cervical cancer 11/19/2017  . Routine general medical examination at a health care facility 11/27/2016  .  Cervical polyp 11/03/2014  . Encounter for routine gynecological examination 08/27/2011  . Vaginitis, atrophic 08/14/2011  . Contact dermatitis 03/02/2008  . Essential hypertension 05/29/2007  . Allergic rhinitis 05/29/2007  . URINARY INCONTINENCE 05/29/2007   Past Medical History:  Diagnosis Date  . Allergic eye reaction   . Allergic rhinitis   . Asthma   . Hair loss   . HTN (hypertension)   . PMS (premenstrual syndrome)   . Rectal tenesmus   . Urinary incontinence 2005    Family History  Problem Relation Age of Onset  . Alcohol abuse Unknown   . Diabetes Unknown   . Hypertension Unknown   . Prostate cancer Unknown   . Colon cancer Neg Hx     Past Surgical History:  Procedure Laterality Date  . ABDOMINAL SURGERY  2006   Tummy Tuck  . AUGMENTATION MAMMAPLASTY Bilateral 2007  . BREAST ENHANCEMENT SURGERY  2006   Saline implants  . CATARACT EXTRACTION Left 10/28/2013  . LIPOSUCTION  2006  . OTHER SURGICAL HISTORY     CB X3  . TUBAL LIGATION  1985   Social History   Occupational History  . Not on file  Tobacco Use  . Smoking status: Never Smoker  . Smokeless tobacco: Never Used  Substance and Sexual Activity  . Alcohol use: Yes    Alcohol/week: 1.0 standard drinks    Types: 1 drink(s) per week  . Drug use: No  . Sexual activity: Not on file

## 2019-11-11 ENCOUNTER — Ambulatory Visit: Payer: Medicare PPO | Attending: Physician Assistant | Admitting: Physical Therapy

## 2019-11-11 DIAGNOSIS — M25512 Pain in left shoulder: Secondary | ICD-10-CM | POA: Insufficient documentation

## 2019-11-11 DIAGNOSIS — M25612 Stiffness of left shoulder, not elsewhere classified: Secondary | ICD-10-CM | POA: Insufficient documentation

## 2019-11-11 DIAGNOSIS — G8929 Other chronic pain: Secondary | ICD-10-CM | POA: Insufficient documentation

## 2019-11-11 DIAGNOSIS — M6281 Muscle weakness (generalized): Secondary | ICD-10-CM | POA: Insufficient documentation

## 2019-11-17 DIAGNOSIS — H40003 Preglaucoma, unspecified, bilateral: Secondary | ICD-10-CM | POA: Diagnosis not present

## 2019-11-19 ENCOUNTER — Ambulatory Visit: Payer: Medicare PPO

## 2019-11-19 ENCOUNTER — Other Ambulatory Visit: Payer: Self-pay

## 2019-11-19 DIAGNOSIS — M25512 Pain in left shoulder: Secondary | ICD-10-CM

## 2019-11-19 DIAGNOSIS — M6281 Muscle weakness (generalized): Secondary | ICD-10-CM

## 2019-11-19 DIAGNOSIS — M25612 Stiffness of left shoulder, not elsewhere classified: Secondary | ICD-10-CM

## 2019-11-19 DIAGNOSIS — G8929 Other chronic pain: Secondary | ICD-10-CM

## 2019-11-19 NOTE — Patient Instructions (Signed)
Active  PROM   Reach behind back,,reach behind head, wall slide   2-3 reps eery 2-3 hours 5-30 sec hold

## 2019-11-19 NOTE — Therapy (Signed)
Wendy Salazar, [place], [address] Phone: [phone]   Fax:  [phone]  Physical Therapy Evaluation  Patient Details  Name: Wendy Salazar MRN: [id number] Date of Birth: August 03, 1946 Referring Provider (PT): Dwana Melena , PA-c   Encounter Date: 11/19/2019  PT End of Session - 11/19/19 0853    Visit Number  1    Number of Visits  12    Date for PT Re-Evaluation  01/01/20    Authorization Type  [employer] MCR    PT Start Time  0915    PT Stop Time  1000    PT Time Calculation (min)  45 min    Activity Tolerance  Patient tolerated treatment well    Behavior During Therapy  [facility] for tasks assessed/performed       Past Medical History:  Diagnosis Date  . Allergic eye reaction   . Allergic rhinitis   . Asthma   . Hair loss   . HTN (hypertension)   . PMS (premenstrual syndrome)   . Rectal tenesmus   . Urinary incontinence 2005    Past Surgical History:  Procedure Laterality Date  . ABDOMINAL SURGERY  2006   Tummy Tuck  . AUGMENTATION MAMMAPLASTY Bilateral 2007  . BREAST ENHANCEMENT SURGERY  2006   Saline implants  . CATARACT EXTRACTION Left 10/28/2013  . LIPOSUCTION  2006  . OTHER SURGICAL HISTORY     CB X3  . TUBAL LIGATION  1985    There were no vitals filed for this visit.   Subjective Assessment - 11/19/19 0924    Subjective  She reports injury with fall in April and reports stiffness and pain started. Limtied functional use.    Med sand modalities do not ease pain.  MD said may have torn RTC or muscle.    She had injection  with a little benefit and can move some better.    Limitations  House hold activities;Lifting   Dressing ,   Diagnostic tests  MRI bursitis and tendon tear    Patient Stated Goals  To use  arm again    Currently in Pain?  Yes    Pain Score  5     Pain Location  Shoulder    Pain Orientation  Left;Anterior;Posterior    Pain Descriptors / Indicators  Sore;Tightness    Pain Type  Chronic pain    Pain Radiating Towards  neck and upper lateral arm.    Pain Onset  More than a month ago    Pain Frequency  Constant    Aggravating Factors   using LT arm    Pain Relieving Factors  Stop activity,         [facility] PT Assessment - 11/19/19 0001      Assessment   Medical Diagnosis  LT shoulder pain    Referring Provider (PT)  Dwana Melena , PA-c    Onset Date/Surgical Date  --   01/2019   Hand Dominance  Right    Next MD Visit  2/21    Prior Therapy  No      Precautions   Precautions  None      Restrictions   Weight Bearing Restrictions  No      Balance Screen   Has the patient fallen in the past 6 months  No      Prior Function   Level of Independence  Needs assistance with homemaking    Vocation  Retired  Cognition   Overall Cognitive Status  Within Functional Limits for tasks assessed      Observation/Other Assessments   Focus on Therapeutic Outcomes (FOTO)   64% limited      ROM / Strength   AROM / PROM / Strength  AROM;PROM;Strength      AROM   AROM Assessment Site  Shoulder    Right/Left Shoulder  Right;Left    Right Shoulder Flexion  155 Degrees    Right Shoulder ABduction  158 Degrees    Right Shoulder Internal Rotation  50 Degrees    Right Shoulder External Rotation  80 Degrees    Right Shoulder Horizontal ABduction  10 Degrees    Right Shoulder Horizontal  ADduction  96 Degrees    Left Shoulder Flexion  89 Degrees    Left Shoulder ABduction  86 Degrees    Left Shoulder Internal Rotation  45 Degrees    Left Shoulder External Rotation  60 Degrees    Left Shoulder Horizontal ABduction  -10 Degrees    Left Shoulder Horizontal ADduction  94 Degrees      PROM   PROM Assessment Site  Shoulder    Right/Left Shoulder  Left    Left Shoulder Flexion  120 Degrees    Left Shoulder ABduction  120 Degrees    Left Shoulder Internal Rotation  60 Degrees    Left Shoulder External Rotation  90 Degrees    Left Shoulder Horizontal  ABduction  10 Degrees      Strength   Strength Assessment Site  Shoulder    Right/Left Shoulder  Left    Left Shoulder Flexion  4/5    Left Shoulder Extension  5/5    Left Shoulder ABduction  4-/5    Left Shoulder Internal Rotation  5/5    Left Shoulder External Rotation  5/5    Left Shoulder Horizontal ABduction  5/5    Left Shoulder Horizontal ADduction  5/5      Ambulation/Gait   Gait Comments  WNL                Objective measurements completed on examination: See above findings.              PT Education - 11/19/19 0901    Education Details  POC,  HEP    Person(s) Educated  Patient    Methods  Explanation;Demonstration;Tactile cues;Verbal cues;Handout    Comprehension  Verbalized understanding;Returned demonstration       PT Short Term Goals - 11/19/19 0905      PT SHORT TERM GOAL #1   Title  She will be indpendent with initial HEP    Time  2    Period  Weeks    Status  New      PT SHORT TERM GOAL #2   Title  She will report pain decreased 25% or more    Time  3    Period  Weeks    Status  New      PT SHORT TERM GOAL #3   Title  AROM of    Lt shoulder  will increase  to   equal RT shoulder    Time  3    Period  Weeks    Status  New        PT Long Term Goals - 11/19/19 [id number]      PT LONG TERM GOAL #1   Title  She will be indpendent with all hEP issued    Time  6    Period  Weeks    Status  New      PT LONG TERM GOAL #2   Title  She will report  pain 50% improved with use at home tasks    Time  6    Period  Weeks    Status  New      PT LONG TERM GOAL #3   Title  She will reurn to normal use of TL arm for home tasks with cleaning and cooking.    Time  6    Period  Weeks    Status  New      PT LONG TERM GOAL #4   Title  She will report able to push basket in shopping with no pain    Time  6    Period  Weeks    Status  New      PT LONG TERM GOAL #5   Title  FOTO score will improve to 40% limited or better  to demo  percieved functional improvement    Time  6    Period  Weeks    Status  New             Plan - 11/19/19 0901    Clinical Impression Statement  Ms Guerin presents with LT shoulder pain and stiffness. and mild weakness. Her pain is minimal at this time and ROM passive was close to LT shoulder but not full range. She should improve over next 4 weeks to normal ROM but may need some extra time for improved strength and return to normal use of LT arm.    Personal Factors and Comorbidities  Time since onset of injury/illness/exacerbation;Age    Examination-Activity Limitations  Carry;Lift;Reach Overhead    Examination-Participation Restrictions  Community Activity;Laundry;Cleaning;Meal Prep    Stability/Clinical Decision Making  Stable/Uncomplicated    Clinical Decision Making  Low    Rehab Potential  Good    PT Frequency  2x / week    PT Duration  6 weeks    PT Treatment/Interventions  Cryotherapy;Electrical Stimulation;Iontophoresis 4mg /ml Dexamethasone;Moist Heat;Ultrasound;Therapeutic activities;Therapeutic exercise;Manual techniques;Patient/family education;Passive range of motion;Taping    PT Next Visit Plan  [place] HEp and progress as able,  Manual and modalities    PT Home Exercise Plan  ROM wall slide , reach behind back and reach behind head    Consulted and Agree with Plan of Care  Patient       Patient will benefit from skilled therapeutic intervention in order to improve the following deficits and impairments:  Pain, Decreased activity tolerance, Decreased strength, Decreased range of motion  Visit Diagnosis: Chronic left shoulder pain  Stiffness of left shoulder, not elsewhere classified  Muscle weakness (generalized)     Problem List Patient Active Problem List   Diagnosis Date Noted  . Screening for cervical cancer 11/19/2017  . Routine general medical examination at a health care facility 11/27/2016  . Cervical polyp 11/03/2014  . Encounter for routine  gynecological examination 08/27/2011  . Vaginitis, atrophic 08/14/2011  . Contact dermatitis 03/02/2008  . Essential hypertension 05/29/2007  . Allergic rhinitis 05/29/2007  . URINARY INCONTINENCE 05/29/2007    Darrel Hoover PT 11/19/2019, 10:02 AM  [facility] [address] [place], [place], [address] Phone: [phone]   Fax:  [phone]  Name: Wendy Salazar MRN: [id number] Date of Birth: 1945-10-30

## 2019-11-24 ENCOUNTER — Encounter: Payer: Medicare Other | Admitting: Internal Medicine

## 2019-11-24 DIAGNOSIS — H40003 Preglaucoma, unspecified, bilateral: Secondary | ICD-10-CM | POA: Diagnosis not present

## 2019-11-25 ENCOUNTER — Other Ambulatory Visit: Payer: Self-pay

## 2019-11-25 ENCOUNTER — Ambulatory Visit: Payer: Medicare PPO | Attending: Physician Assistant | Admitting: Physical Therapy

## 2019-11-25 ENCOUNTER — Encounter: Payer: Self-pay | Admitting: Physical Therapy

## 2019-11-25 DIAGNOSIS — G8929 Other chronic pain: Secondary | ICD-10-CM | POA: Diagnosis not present

## 2019-11-25 DIAGNOSIS — M25512 Pain in left shoulder: Secondary | ICD-10-CM | POA: Insufficient documentation

## 2019-11-25 DIAGNOSIS — M6281 Muscle weakness (generalized): Secondary | ICD-10-CM | POA: Insufficient documentation

## 2019-11-25 DIAGNOSIS — M25612 Stiffness of left shoulder, not elsewhere classified: Secondary | ICD-10-CM | POA: Insufficient documentation

## 2019-11-25 NOTE — Therapy (Signed)
[place], [place], [address] Phone: [phone]   Fax:  [phone]  Physical Therapy Treatment  Patient Details  Name: Wendy Salazar MRN: [id number] Date of Birth: [birth year] Referring Provider (PT): Dwana Melena , PA-c   Encounter Date: 11/25/2019  PT End of Session - 11/25/19 1559    Visit Number  2    Number of Visits  12    Date for PT Re-Evaluation  01/01/20    Authorization Type  [employer] MCR    PT Start Time  [id number]    PT Stop Time  1635    PT Time Calculation (min)  40 min    Activity Tolerance  Patient tolerated treatment well    Behavior During Therapy  [facility] for tasks assessed/performed       Past Medical History:  Diagnosis Date  . Allergic eye reaction   . Allergic rhinitis   . Asthma   . Hair loss   . HTN (hypertension)   . PMS (premenstrual syndrome)   . Rectal tenesmus   . Urinary incontinence 2005    Past Surgical History:  Procedure Laterality Date  . ABDOMINAL SURGERY  2006   Tummy Tuck  . AUGMENTATION MAMMAPLASTY Bilateral 2007  . BREAST ENHANCEMENT SURGERY  2006   Saline implants  . CATARACT EXTRACTION Left 10/28/2013  . LIPOSUCTION  2006  . OTHER SURGICAL HISTORY     CB X3  . TUBAL LIGATION  1985    There were no vitals filed for this visit.  Subjective Assessment - 11/25/19 1558    Subjective  Patient reports she is feeling a little better and she is building strength in the shoulder.    Patient Stated Goals  To use  arm again    Currently in Pain?  Yes    Pain Score  0-No pain    Pain Location  Shoulder    Pain Orientation  Left    Pain Descriptors / Indicators  Sore;Tightness    Pain Type  Chronic pain    Pain Onset  More than a month ago    Pain Frequency  Constant         [facility] PT Assessment - 11/25/19 0001      AROM   Left Shoulder Flexion  125 Degrees    Left Shoulder Internal Rotation  --   T10 reach behind back   Left Shoulder External  Rotation  --   C7 reach above head                  [facility] Adult PT Treatment/Exercise - 11/25/19 0001      Exercises   Exercises  Shoulder      Shoulder Exercises: Supine   Flexion  AAROM;10 reps   2 sets   Flexion Limitations  dowel    Other Supine Exercises  Chest press with dowel and 6# ankle weight 2x10      Shoulder Exercises: Standing   External Rotation  10 reps   2 sets   Theraband Level (Shoulder External Rotation)  Level 1 (Yellow)    Internal Rotation  10 reps   2 sets   Theraband Level (Shoulder Internal Rotation)  Level 1 (Yellow)    Extension  15 reps   2 sets   Theraband Level (Shoulder Extension)  Level 2 (Red)    Row  15 reps   2 sets   Theraband Level (Shoulder Row)  Level 2 (Red)  Shoulder Exercises: ROM/Strengthening   UBE (Upper Arm Bike)  L1 x4 min (2 fwd/bwd)      Shoulder Exercises: Stretch   Wall Stretch - Flexion Limitations  2x10             PT Education - 11/25/19 1558    Education Details  HEP    Person(s) Educated  Patient    Methods  Explanation;Demonstration;Verbal cues    Comprehension  Verbalized understanding;Returned demonstration;Verbal cues required;Need further instruction       PT Short Term Goals - 11/19/19 0905      PT SHORT TERM GOAL #1   Title  She will be indpendent with initial HEP    Time  2    Period  Weeks    Status  New      PT SHORT TERM GOAL #2   Title  She will report pain decreased 25% or more    Time  3    Period  Weeks    Status  New      PT SHORT TERM GOAL #3   Title  AROM of    Lt shoulder  will increase  to   equal RT shoulder    Time  3    Period  Weeks    Status  New        PT Long Term Goals - 11/19/19 [id number]      PT LONG TERM GOAL #1   Title  She will be indpendent with all hEP issued    Time  6    Period  Weeks    Status  New      PT LONG TERM GOAL #2   Title  She will report  pain 50% improved with use at home tasks    Time  6    Period  Weeks    Status   New      PT LONG TERM GOAL #3   Title  She will reurn to normal use of TL arm for home tasks with cleaning and cooking.    Time  6    Period  Weeks    Status  New      PT LONG TERM GOAL #4   Title  She will report able to push basket in shopping with no pain    Time  6    Period  Weeks    Status  New      PT LONG TERM GOAL #5   Title  FOTO score will improve to 40% limited or better  to demo percieved functional improvement    Time  6    Period  Weeks    Status  New            Plan - 11/25/19 1559    Clinical Impression Statement  Patient seems to be progressing well with her exercises and exhibits improved motion of the left shoulder compared to her previous visit. Therapy focued on progressing her shoulder motion and strength with good tolerance. She does require cueing for technique and to avoid shrugging in order to maintain proper shoulder mechanics. She would benefit from continued skilled PT to progress left shoulder range of motion and strength to improve overhead function and reduce pain.    PT Treatment/Interventions  Cryotherapy;Electrical Stimulation;Iontophoresis 4mg /ml Dexamethasone;Moist Heat;Ultrasound;Therapeutic activities;Therapeutic exercise;Manual techniques;Patient/family education;Passive range of motion;Taping    PT Next Visit Plan  Assess response to banded exercises and provide as HEP, review HEP and progress PRN, manual PRN for  range of motion, continue AAROM stretching    PT Home Exercise Plan  Forward wall slide, active reaching behind back and above head    Consulted and Agree with Plan of Care  Patient       Patient will benefit from skilled therapeutic intervention in order to improve the following deficits and impairments:  Pain, Decreased activity tolerance, Decreased strength, Decreased range of motion  Visit Diagnosis: Chronic left shoulder pain  Stiffness of left shoulder, not elsewhere classified  Muscle weakness  (generalized)     Problem List Patient Active Problem List   Diagnosis Date Noted  . Screening for cervical cancer 11/19/2017  . Routine general medical examination at a health care facility 11/27/2016  . Cervical polyp 11/03/2014  . Encounter for routine gynecological examination 08/27/2011  . Vaginitis, atrophic 08/14/2011  . Contact dermatitis 03/02/2008  . Essential hypertension 05/29/2007  . Allergic rhinitis 05/29/2007  . URINARY INCONTINENCE 05/29/2007    Hilda Blades, PT, DPT, LAT, ATC 11/25/19  4:45 PM Phone: [phone] Fax: [facility] [address] [place], [place], [address] Phone: [phone]   Fax:  [phone]  Name: Wendy Salazar MRN: [id number] Date of Birth: May 11, 1946

## 2019-11-27 ENCOUNTER — Other Ambulatory Visit: Payer: Self-pay

## 2019-11-27 ENCOUNTER — Ambulatory Visit: Payer: Medicare PPO | Admitting: Physical Therapy

## 2019-11-27 ENCOUNTER — Encounter: Payer: Self-pay | Admitting: Physical Therapy

## 2019-11-27 DIAGNOSIS — G8929 Other chronic pain: Secondary | ICD-10-CM | POA: Diagnosis not present

## 2019-11-27 DIAGNOSIS — M6281 Muscle weakness (generalized): Secondary | ICD-10-CM | POA: Diagnosis not present

## 2019-11-27 DIAGNOSIS — M25612 Stiffness of left shoulder, not elsewhere classified: Secondary | ICD-10-CM

## 2019-11-27 DIAGNOSIS — M25512 Pain in left shoulder: Secondary | ICD-10-CM | POA: Diagnosis not present

## 2019-11-27 NOTE — Therapy (Signed)
[place], [place], [address] Phone: [phone]   Fax:  [phone]  Physical Therapy Treatment  Patient Details  Name: Wendy Salazar MRN: [id number] Date of Birth: 1945/12/19 Referring Provider (PT): Dwana Melena , PA-c   Encounter Date: 11/27/2019  PT End of Session - 11/27/19 1051    Visit Number  3    Number of Visits  12    Date for PT Re-Evaluation  01/01/20    Authorization Type  [employer] MCR    PT Start Time  [id number]    PT Stop Time  1128    PT Time Calculation (min)  41 min    Activity Tolerance  Patient tolerated treatment well    Behavior During Therapy  [facility] for tasks assessed/performed       Past Medical History:  Diagnosis Date  . Allergic eye reaction   . Allergic rhinitis   . Asthma   . Hair loss   . HTN (hypertension)   . PMS (premenstrual syndrome)   . Rectal tenesmus   . Urinary incontinence 2005    Past Surgical History:  Procedure Laterality Date  . ABDOMINAL SURGERY  2006   Tummy Tuck  . AUGMENTATION MAMMAPLASTY Bilateral 2007  . BREAST ENHANCEMENT SURGERY  2006   Saline implants  . CATARACT EXTRACTION Left 10/28/2013  . LIPOSUCTION  2006  . OTHER SURGICAL HISTORY     CB X3  . TUBAL LIGATION  1985    There were no vitals filed for this visit.  Subjective Assessment - 11/27/19 1050    Subjective  " I am doing better, the stiffness is better"    Patient Stated Goals  To use  arm again    Currently in Pain?  No/denies    Pain Score  0-No pain    Pain Orientation  Left    Pain Onset  More than a month ago    Pain Frequency  Intermittent    Aggravating Factors   reaching back         [facility] PT Assessment - 11/27/19 1104      Assessment   Medical Diagnosis  LT shoulder pain    Referring Provider (PT)  Dwana Melena , PA-c                   [facility] Adult PT Treatment/Exercise - 11/27/19 0001      Shoulder Exercises: Standing   External Rotation   Strengthening;Theraband;10 reps;Left    Theraband Level (Shoulder External Rotation)  Level 2 (Red)   cues to avoid shoulder hiking   Internal Rotation  Strengthening;10 reps;Theraband;Left    Theraband Level (Shoulder Internal Rotation)  Level 2 (Red)   cues to avoid shoulder hiking   Flexion  Strengthening;Both;12 reps   scaption angle with no weight   Row  Strengthening;10 reps;Theraband   cues to avoid shoulder hiking   Theraband Level (Shoulder Row)  Level 2 (Red)      Shoulder Exercises: ROM/Strengthening   UBE (Upper Arm Bike)  L1 x 5 min  (2:30 fwd/bwd)      Manual Therapy   Manual Therapy  Soft tissue mobilization;Joint mobilization;Scapular mobilization    Manual therapy comments  MTPR along teres minor, infraspinatus, and upper trap    Joint Mobilization  PA/AP grade III glenohumeral mobs    Scapular Mobilization  L scapular upward assist peformed during active flexion/ abduction 1 x 10 ea.  PT Education - 11/27/19 1130    Education Details  reviewed HEP and updated today.    Person(s) Educated  Patient    Methods  Explanation;Verbal cues    Comprehension  Verbalized understanding;Verbal cues required       PT Short Term Goals - 11/19/19 0905      PT SHORT TERM GOAL #1   Title  She will be indpendent with initial HEP    Time  2    Period  Weeks    Status  New      PT SHORT TERM GOAL #2   Title  She will report pain decreased 25% or more    Time  3    Period  Weeks    Status  New      PT SHORT TERM GOAL #3   Title  AROM of    Lt shoulder  will increase  to   equal RT shoulder    Time  3    Period  Weeks    Status  New        PT Long Term Goals - 11/19/19 [id number]      PT LONG TERM GOAL #1   Title  She will be indpendent with all hEP issued    Time  6    Period  Weeks    Status  New      PT LONG TERM GOAL #2   Title  She will report  pain 50% improved with use at home tasks    Time  6    Period  Weeks    Status  New      PT  LONG TERM GOAL #3   Title  She will reurn to normal use of TL arm for home tasks with cleaning and cooking.    Time  6    Period  Weeks    Status  New      PT LONG TERM GOAL #4   Title  She will report able to push basket in shopping with no pain    Time  6    Period  Weeks    Status  New      PT LONG TERM GOAL #5   Title  FOTO score will improve to 40% limited or better  to demo percieved functional improvement    Time  [place] - 11/27/19 1134    Clinical Impression Statement  Wendy Salazar reports she is doing better with pain only at and ranges of available flexion/ abduction. continued mobs to promote shoulder ROM and STW to relieve tension in the teres minor and infraspinatus. She was able to do all exercises but did require intermittent cues to reduce shoulder hiking. upated HEP for shoulder IR/ER and rows. End of session she reported she has more motion and improved pain.    PT Next Visit Plan  hows theraband exercises going,  review HEP and progress PRN, manual PRN for range of motion, continue AAROM stretching    PT Home Exercise Plan  Forward wall slide, active reaching behind back and above head, IR/ ER and rows with red band    Consulted and Agree with Plan of Care  Patient       Patient will benefit from skilled therapeutic intervention in order to improve the following deficits and impairments:  Pain, Decreased activity tolerance,  Decreased strength, Decreased range of motion  Visit Diagnosis: Chronic left shoulder pain  Stiffness of left shoulder, not elsewhere classified  Muscle weakness (generalized)     Problem List Patient Active Problem List   Diagnosis Date Noted  . Screening for cervical cancer 11/19/2017  . Routine general medical examination at a health care facility 11/27/2016  . Cervical polyp 11/03/2014  . Encounter for routine gynecological examination 08/27/2011  . Vaginitis, atrophic 08/14/2011   . Contact dermatitis 03/02/2008  . Essential hypertension 05/29/2007  . Allergic rhinitis 05/29/2007  . URINARY INCONTINENCE 05/29/2007    Starr [place] PT, DPT, LAT, ATC  11/27/19  11:41 AM      [facility] [address] [place], [place], [address] Phone: [phone]   Fax:  [phone]  Name: Wendy Salazar MRN: [id number] Date of Birth: [birth year]

## 2019-12-01 ENCOUNTER — Other Ambulatory Visit: Payer: Self-pay

## 2019-12-01 ENCOUNTER — Ambulatory Visit
Admission: RE | Admit: 2019-12-01 | Discharge: 2019-12-01 | Disposition: A | Discharge status: Home / Self Care | Payer: Medicare Other | Source: Ambulatory Visit | Attending: Family Medicine | Admitting: Family Medicine

## 2019-12-01 ENCOUNTER — Ambulatory Visit: Payer: Medicare PPO

## 2019-12-01 DIAGNOSIS — M6281 Muscle weakness (generalized): Secondary | ICD-10-CM

## 2019-12-01 DIAGNOSIS — M25612 Stiffness of left shoulder, not elsewhere classified: Secondary | ICD-10-CM

## 2019-12-01 DIAGNOSIS — G8929 Other chronic pain: Secondary | ICD-10-CM | POA: Diagnosis not present

## 2019-12-01 DIAGNOSIS — Z1231 Encounter for screening mammogram for malignant neoplasm of breast: Secondary | ICD-10-CM | POA: Diagnosis not present

## 2019-12-01 DIAGNOSIS — M25512 Pain in left shoulder: Secondary | ICD-10-CM | POA: Diagnosis not present

## 2019-12-01 NOTE — Therapy (Signed)
[place], [place], [address] Phone: [phone]   Fax:  [phone]  Physical Therapy Treatment  Patient Details  Name: Wendy Salazar MRN: [id number] Date of Birth: 1946-01-01 Referring Provider (PT): Dwana Melena , PA-c   Encounter Date: 12/01/2019  PT End of Session - 12/01/19 1357    Visit Number  4    Number of Visits  12    Date for PT Re-Evaluation  01/01/20    Authorization Type  [employer] MCR    PT Start Time  0200    PT Stop Time  0230    PT Time Calculation (min)  30 min    Activity Tolerance  Patient tolerated treatment well    Behavior During Therapy  [facility] for tasks assessed/performed       Past Medical History:  Diagnosis Date  . Allergic eye reaction   . Allergic rhinitis   . Asthma   . Hair loss   . HTN (hypertension)   . PMS (premenstrual syndrome)   . Rectal tenesmus   . Urinary incontinence 2005    Past Surgical History:  Procedure Laterality Date  . ABDOMINAL SURGERY  2006   Tummy Tuck  . AUGMENTATION MAMMAPLASTY Bilateral 2007  . BREAST ENHANCEMENT SURGERY  2006   Saline implants  . CATARACT EXTRACTION Left 10/28/2013  . LIPOSUCTION  2006  . OTHER SURGICAL HISTORY     CB X3  . TUBAL LIGATION  1985    There were no vitals filed for this visit.  Subjective Assessment - 12/01/19 1358    Subjective  No pain today. I'm getting better.    Currently in Pain?  No/denies                       [facility] Adult PT Treatment/Exercise - 12/01/19 0001      Therapeutic Activites    Therapeutic Activities  Lifting    Lifting  Progressed to 40# lifting from floor to waist without pain.   though she felt this was the max weight she could do without excess strain. She reported previously able to lift 40# bags of soil       Shoulder Exercises: Standing   External Rotation  Both;15 reps    Theraband Level (Shoulder External Rotation)  Level 2 (Red)    Internal  Rotation  Both;15 reps    Theraband Level (Shoulder Internal Rotation)  Level 2 (Red)    Flexion  Both;15 reps    Theraband Level (Shoulder Flexion)  Level 2 (Red)    Flexion Limitations  punches    Extension  Both;15 reps    Theraband Level (Shoulder Extension)  Level 2 (Red)    Row  Both;15 reps    Theraband Level (Shoulder Row)  Level 2 (Red)    Other Standing Exercises  carry 10 # bilaterally   reaching  to upper cabinet  with 2-3-4 #  x 5 with 3# max weight with least discomfort     Other Standing Exercises  All exercises above done x 10 with green band      Shoulder Exercises: ROM/Strengthening   UBE (Upper Arm Bike)  L2   1/2 forward , 1/2 back   min               PT Short Term Goals - 12/01/19 1438      PT SHORT TERM GOAL #1   Title  She will be indpendent with  initial HEP    Status  Achieved      PT SHORT TERM GOAL #2   Title  She will report pain decreased 25% or more    Status  Achieved      PT SHORT TERM GOAL #3   Title  AROM of    Lt shoulder  will increase  to   equal RT shoulder    Status  Partially Met        PT Long Term Goals - 11/19/19 0906      PT LONG TERM GOAL #1   Title  She will be indpendent with all hEP issued    Time  6    Period  Weeks    Status  New      PT LONG TERM GOAL #2   Title  She will report  pain 50% improved with use at home tasks    Time  6    Period  Weeks    Status  New      PT LONG TERM GOAL #3   Title  She will reurn to normal use of TL arm for home tasks with cleaning and cooking.    Time  6    Period  Weeks    Status  New      PT LONG TERM GOAL #4   Title  She will report able to push basket in shopping with no pain    Time  6    Period  Weeks    Status  New      PT LONG TERM GOAL #5   Title  FOTO score will improve to 40% limited or better  to demo percieved functional improvement    Time  6    Period  Weeks    Status  New            Plan - 12/01/19 1357    Clinical Impression Statement   issued green band for home. no pain with all activity but she said she was tired and did not want to do more at end of session.    Green band with exercises here and progress to blue as able . Cont lifting.    PT Treatment/Interventions  Cryotherapy;Electrical Stimulation;Iontophoresis 33m/ml Dexamethasone;Moist Heat;Ultrasound;Therapeutic activities;Therapeutic exercise;Manual techniques;Patient/family education;Passive range of motion;Taping    PT Next Visit Plan  hows theraband exercises going,  review HEP and progress PRN, manual PRN for range of motion, continue AAROM stretching    PT Home Exercise Plan  Forward wall slide, active reaching behind back and above head, IR/ ER and rows with red band    Consulted and Agree with Plan of Care  Patient       Patient will benefit from skilled therapeutic intervention in order to improve the following deficits and impairments:  Pain, Decreased activity tolerance, Decreased strength, Decreased range of motion  Visit Diagnosis: Chronic left shoulder pain  Stiffness of left shoulder, not elsewhere classified  Muscle weakness (generalized)     Problem List Patient Active Problem List   Diagnosis Date Noted  . Screening for cervical cancer 11/19/2017  . Routine general medical examination at a health care facility 11/27/2016  . Cervical polyp 11/03/2014  . Encounter for routine gynecological examination 08/27/2011  . Vaginitis, atrophic 08/14/2011  . Contact dermatitis 03/02/2008  . Essential hypertension 05/29/2007  . Allergic rhinitis 05/29/2007  . URINARY INCONTINENCE 05/29/2007    CDarrel Hoover  PT 12/01/2019, 2:39 PM  [facility] [address] [place], [place], [address] Phone: [phone]   Fax:  [phone]  Name: Wendy Salazar MRN: [id number] Date of Birth: [birth year]

## 2019-12-03 ENCOUNTER — Encounter: Payer: Self-pay | Admitting: Physical Therapy

## 2019-12-03 ENCOUNTER — Ambulatory Visit: Payer: Medicare PPO | Admitting: Physical Therapy

## 2019-12-03 ENCOUNTER — Other Ambulatory Visit: Payer: Self-pay

## 2019-12-03 DIAGNOSIS — G8929 Other chronic pain: Secondary | ICD-10-CM | POA: Diagnosis not present

## 2019-12-03 DIAGNOSIS — M6281 Muscle weakness (generalized): Secondary | ICD-10-CM

## 2019-12-03 DIAGNOSIS — M25512 Pain in left shoulder: Secondary | ICD-10-CM | POA: Diagnosis not present

## 2019-12-03 DIAGNOSIS — M25612 Stiffness of left shoulder, not elsewhere classified: Secondary | ICD-10-CM

## 2019-12-03 LAB — HM MAMMOGRAPHY

## 2019-12-03 NOTE — Therapy (Signed)
[place], [place], [address] Phone: [phone]   Fax:  [phone]  Physical Therapy Treatment  Patient Details  Name: Wendy Salazar MRN: [id number] Date of Birth: 03/06/1946 Referring Provider (PT): Dwana Melena , PA-c   Encounter Date: 12/03/2019  PT End of Session - 12/03/19 1026    Visit Number  5    Number of Visits  12    Date for PT Re-Evaluation  01/01/20    Authorization Type  [employer] MCR    PT Start Time  1025   arrived late   PT Stop Time  1100    PT Time Calculation (min)  35 min    Activity Tolerance  Patient tolerated treatment well    Behavior During Therapy  [facility] for tasks assessed/performed       Past Medical History:  Diagnosis Date  . Allergic eye reaction   . Allergic rhinitis   . Asthma   . Hair loss   . HTN (hypertension)   . PMS (premenstrual syndrome)   . Rectal tenesmus   . Urinary incontinence 2005    Past Surgical History:  Procedure Laterality Date  . ABDOMINAL SURGERY  2006   Tummy Tuck  . AUGMENTATION MAMMAPLASTY Bilateral 2007  . BREAST ENHANCEMENT SURGERY  2006   Saline implants  . CATARACT EXTRACTION Left 10/28/2013  . LIPOSUCTION  2006  . OTHER SURGICAL HISTORY     CB X3  . TUBAL LIGATION  1985    There were no vitals filed for this visit.  Subjective Assessment - 12/03/19 1026    Subjective  Left shoulder not as stiffness as it was. No pain pre-tx. but continues with intermittent pain with reaching activities for left side. Theraband exercises for HEP going OK.    Limitations  House hold activities;Lifting    Diagnostic tests  MRI bursitis and tendon tear    Patient Stated Goals  To use  arm again    Currently in Pain?  No/denies                       [facility] Adult PT Treatment/Exercise - 12/03/19 0001      Shoulder Exercises: Supine   Protraction  AROM;Strengthening;Left;20 reps    Protraction Weight (lbs)  1    Protraction  Limitations  serratus punch    Horizontal ABduction  AROM;Strengthening;Both;15 reps    Theraband Level (Shoulder Horizontal ABduction)  Level 2 (Red)      Shoulder Exercises: Sidelying   External Rotation  AROM;Strengthening;Left;20 reps    External Rotation Weight (lbs)  1    ABduction  AROM;Strengthening;Left;20 reps    ABduction Limitations  plane of scaption      Shoulder Exercises: Standing   External Rotation  AROM;Strengthening;Left;20 reps    Theraband Level (Shoulder External Rotation)  Level 2 (Red)    Internal Rotation  AROM;Strengthening;Left;20 reps    Theraband Level (Shoulder Internal Rotation)  Level 2 (Red)    Flexion  AROM;Strengthening;Left;20 reps    Theraband Level (Shoulder Flexion)  Level 2 (Red)    Flexion Limitations  punches    Extension  AROM;Strengthening;Both;20 reps    Theraband Level (Shoulder Extension)  Level 3 (Green)    Row  AROM;Strengthening;Both;20 reps    Theraband Level (Shoulder Row)  Level 3 (Green)    Other Standing Exercises  shoulder flexion and scaption to 90 deg full can 10 ea. 1 lb.  Shoulder Exercises: ROM/Strengthening   UBE (Upper Arm Bike)  L2 x 5 min with 2.5 min ea. fw/rev      Manual Therapy   Manual Therapy  Soft tissue mobilization;Passive ROM    Joint Mobilization  left GH mobilization grade I-III caudal and AP glides    Soft tissue mobilization  STM left infraspinatus and subscapularis    Passive ROM  Left shoulder PROM             PT Education - 12/03/19 1058    Education Details  exercise form, POC, HEP review    Person(s) Educated  Patient    Methods  Explanation;Demonstration;Tactile cues;Verbal cues    Comprehension  Verbalized understanding;Returned demonstration       PT Short Term Goals - 12/01/19 1438      PT SHORT TERM GOAL #1   Title  She will be indpendent with initial HEP    Status  Achieved      PT SHORT TERM GOAL #2   Title  She will report pain decreased 25% or more    Status   Achieved      PT SHORT TERM GOAL #3   Title  AROM of    Lt shoulder  will increase  to   equal RT shoulder    Status  Partially Met        PT Long Term Goals - 11/19/19 0906      PT LONG TERM GOAL #1   Title  She will be indpendent with all hEP issued    Time  6    Period  Weeks    Status  New      PT LONG TERM GOAL #2   Title  She will report  pain 50% improved with use at home tasks    Time  6    Period  Weeks    Status  New      PT LONG TERM GOAL #3   Title  She will reurn to normal use of TL arm for home tasks with cleaning and cooking.    Time  6    Period  Weeks    Status  New      PT LONG TERM GOAL #4   Title  She will report able to push basket in shopping with no pain    Time  6    Period  Weeks    Status  New      PT LONG TERM GOAL #5   Title  FOTO score will improve to 40% limited or better  to demo percieved functional improvement    Time  6    Period  Weeks    Status  New            Plan - 12/03/19 1133    Clinical Impression Statement  Still with left>right shoulder weakness limting functional abilities for lifting activities but strength/reaching ability slowly improving. MIld tightness limiting end-ranges of flexion, abduction and ER but improved with decreased tightness following manual. Cues to avoid shoulder shrug with shoulder elevation and min-mod cues for elbow position with Theraband and mat-based exercises.    Personal Factors and Comorbidities  Time since onset of injury/illness/exacerbation;Age    Examination-Activity Limitations  Carry;Lift;Reach Overhead    Examination-Participation Restrictions  Community Activity;Laundry;Cleaning;Meal Prep    Stability/Clinical Decision Making  Stable/Uncomplicated    Clinical Decision Making  Low    Rehab Potential  Good    PT Frequency  2x /  week    PT Duration  6 weeks    PT Treatment/Interventions  Cryotherapy;Electrical Stimulation;Iontophoresis 61m/ml Dexamethasone;Moist  Heat;Ultrasound;Therapeutic activities;Therapeutic exercise;Manual techniques;Patient/family education;Passive range of motion;Taping    PT Next Visit Plan  continue review/progress HEP, rotator cuff and periscapular strengthening and stabilization, stretches, manual    PT Home Exercise Plan  Forward wall slide, active reaching behind back and above head, IR/ ER and rows with red band    Consulted and Agree with Plan of Care  Patient       Patient will benefit from skilled therapeutic intervention in order to improve the following deficits and impairments:  Pain, Decreased activity tolerance, Decreased strength, Decreased range of motion  Visit Diagnosis: Chronic left shoulder pain  Stiffness of left shoulder, not elsewhere classified  Muscle weakness (generalized)     Problem List Patient Active Problem List   Diagnosis Date Noted  . Screening for cervical cancer 11/19/2017  . Routine general medical examination at a health care facility 11/27/2016  . Cervical polyp 11/03/2014  . Encounter for routine gynecological examination 08/27/2011  . Vaginitis, atrophic 08/14/2011  . Contact dermatitis 03/02/2008  . Essential hypertension 05/29/2007  . Allergic rhinitis 05/29/2007  . URINARY INCONTINENCE 05/29/2007    CBeaulah Dinning PT, DPT 12/03/19 11:50 AM  [facility] [address]Phone: [phone]  Fax:  [phone] Name: VTAKAYA HYSLOPMRN: [id number] of Birth: [birth year]

## 2019-12-07 ENCOUNTER — Ambulatory Visit: Payer: Medicare PPO

## 2019-12-10 ENCOUNTER — Ambulatory Visit: Payer: Medicare PPO

## 2019-12-10 ENCOUNTER — Ambulatory Visit: Payer: Medicare PPO | Admitting: Orthopaedic Surgery

## 2019-12-10 ENCOUNTER — Encounter: Payer: Self-pay | Admitting: Internal Medicine

## 2019-12-14 ENCOUNTER — Ambulatory Visit: Payer: Medicare PPO

## 2019-12-14 ENCOUNTER — Other Ambulatory Visit: Payer: Self-pay

## 2019-12-14 DIAGNOSIS — M6281 Muscle weakness (generalized): Secondary | ICD-10-CM

## 2019-12-14 DIAGNOSIS — M25512 Pain in left shoulder: Secondary | ICD-10-CM

## 2019-12-14 DIAGNOSIS — G8929 Other chronic pain: Secondary | ICD-10-CM

## 2019-12-14 DIAGNOSIS — M25612 Stiffness of left shoulder, not elsewhere classified: Secondary | ICD-10-CM | POA: Diagnosis not present

## 2019-12-14 NOTE — Therapy (Signed)
[place], [place], [address] Phone: [phone]   Fax:  [phone]  Physical Therapy Treatment  Patient Details  Name: Wendy Salazar MRN: [id number] Date of Birth: [birth year] Referring Provider (PT): Dwana Melena , PA-c   Encounter Date: 12/14/2019  PT End of Session - 12/14/19 1435    Visit Number  6    Number of Visits  12    Date for PT Re-Evaluation  01/01/20    Authorization Type  [employer] MCR    PT Start Time  0235    PT Stop Time  0315    PT Time Calculation (min)  40 min    Activity Tolerance  Patient tolerated treatment well    Behavior During Therapy  [facility] for tasks assessed/performed       Past Medical History:  Diagnosis Date  . Allergic eye reaction   . Allergic rhinitis   . Asthma   . Hair loss   . HTN (hypertension)   . PMS (premenstrual syndrome)   . Rectal tenesmus   . Urinary incontinence 2005    Past Surgical History:  Procedure Laterality Date  . ABDOMINAL SURGERY  2006   Tummy Tuck  . AUGMENTATION MAMMAPLASTY Bilateral 2007  . BREAST ENHANCEMENT SURGERY  2006   Saline implants  . CATARACT EXTRACTION Left 10/28/2013  . LIPOSUCTION  2006  . OTHER SURGICAL HISTORY     CB X3  . TUBAL LIGATION  1985    There were no vitals filed for this visit.  Subjective Assessment - 12/14/19 1439    Subjective  No pain . Doing etter than at start.    Currently in Pain?  No/denies         [facility] PT Assessment - 12/14/19 0001      Observation/Other Assessments   Focus on Therapeutic Outcomes (FOTO)   35% limited                    [facility] Adult PT Treatment/Exercise - 12/14/19 0001      Shoulder Exercises: Seated   Abduction  AAROM;Left;10 reps    ABduction Limitations  hold at full ROM x 10 with slow eccentric lowering  and 90 degrees abduction with 10 sec hold x 10 reps       Shoulder Exercises: Sidelying   External Rotation  AROM;Strengthening;Left;20 reps     External Rotation Weight (lbs)  2    ABduction  AROM;Strengthening;Left;20 reps    ABduction Weight (lbs)  2      Shoulder Exercises: Standing   External Rotation  AROM;Strengthening;Left;20 reps    Theraband Level (Shoulder External Rotation)  Level 3 (Green)    Internal Rotation  AROM;Strengthening;Left;20 reps    Theraband Level (Shoulder Internal Rotation)  Level 3 (Green)    Flexion  AROM;Strengthening;Left;20 reps    Theraband Level (Shoulder Flexion)  Level 3 (Green)    Flexion Limitations  punches    Extension  AROM;Strengthening;Both;20 reps    Theraband Level (Shoulder Extension)  Level 3 (Green)    Row  AROM;Strengthening;Both;20 reps    Theraband Level (Shoulder Row)  Level 3 (Green)    Other Standing Exercises  carry 15 # LT  around room x 2 . nop pain   reaching  to upper cabinet  with 2-3-4 #  x 5 with 3# max weight with least discomfort     Other Standing Exercises  shoulder flexion and scaption to 90 deg full can 10  ea. 1-2-3 lb.      Shoulder Exercises: ROM/Strengthening   UBE (Upper Arm Bike)  L2.5   x 6 min with 3 min ea. fw/rev               PT Short Term Goals - 12/01/19 1438      PT SHORT TERM GOAL #1   Title  She will be indpendent with initial HEP    Status  Achieved      PT SHORT TERM GOAL #2   Title  She will report pain decreased 25% or more    Status  Achieved      PT SHORT TERM GOAL #3   Title  AROM of    Lt shoulder  will increase  to   equal RT shoulder    Status  Partially Met        PT Long Term Goals - 12/14/19 1518      PT LONG TERM GOAL #1   Title  She will be indpendent with all hEP issued    Status  On-going      PT LONG TERM GOAL #2   Title  She will report  pain 50% improved with use at home tasks    Status  Achieved      PT LONG TERM GOAL #3   Title  She will reurn to normal use of TL arm for home tasks with cleaning and cooking.    Status  Partially Met      PT LONG TERM GOAL #4   Title  She will report able  to push basket in shopping with no pain    Status  Unable to assess      PT LONG TERM GOAL #5   Title  FOTO score will improve to 40% limited or better  to demo percieved functional improvement    Status  Achieved            Plan - 12/14/19 1435    Clinical Impression Statement  FOTO exceed predictive score and she is slowly improving strength. Will continue to progress  strength.    PT Treatment/Interventions  Cryotherapy;Electrical Stimulation;Iontophoresis 97m/ml Dexamethasone;Moist Heat;Ultrasound;Therapeutic activities;Therapeutic exercise;Manual techniques;Patient/family education;Passive range of motion;Taping    PT Next Visit Plan  continue review/progress HEP, rotator cuff and periscapular strengthening and stabilization, stretches, manual   check goals.    PT Home Exercise Plan  Forward wall slide, active reaching behind back and above head, IR/ ER and rows with red band    Consulted and Agree with Plan of Care  Patient       Patient will benefit from skilled therapeutic intervention in order to improve the following deficits and impairments:  Pain, Decreased activity tolerance, Decreased strength, Decreased range of motion  Visit Diagnosis: Chronic left shoulder pain  Stiffness of left shoulder, not elsewhere classified  Muscle weakness (generalized)     Problem List Patient Active Problem List   Diagnosis Date Noted  . Screening for cervical cancer 11/19/2017  . Routine general medical examination at a health care facility 11/27/2016  . Cervical polyp 11/03/2014  . Encounter for routine gynecological examination 08/27/2011  . Vaginitis, atrophic 08/14/2011  . Contact dermatitis 03/02/2008  . Essential hypertension 05/29/2007  . Allergic rhinitis 05/29/2007  . URINARY INCONTINENCE 05/29/2007    CDarrel Hoover PT 12/14/2019, 3:19 PM  [facility] [address]Phone:  [phone]  Fax:  [phone] Name:  MAXX CALAWAY MRN: [id number] Date of Birth: 1945-10-27

## 2019-12-15 ENCOUNTER — Ambulatory Visit (INDEPENDENT_AMBULATORY_CARE_PROVIDER_SITE_OTHER): Payer: Medicare PPO | Admitting: Orthopaedic Surgery

## 2019-12-15 ENCOUNTER — Encounter: Payer: Self-pay | Admitting: Orthopaedic Surgery

## 2019-12-15 DIAGNOSIS — G8929 Other chronic pain: Secondary | ICD-10-CM | POA: Diagnosis not present

## 2019-12-15 DIAGNOSIS — M25512 Pain in left shoulder: Secondary | ICD-10-CM

## 2019-12-15 DIAGNOSIS — M79644 Pain in right finger(s): Secondary | ICD-10-CM

## 2019-12-15 NOTE — Progress Notes (Signed)
Office Visit Note   Patient: Wendy Salazar           Date of Birth: Jul 21, 1946           MRN: [id number] Visit Date: 12/15/2019              Requested by: Isaac Bliss, Rayford Halsted, MD [place],  [place] [address] PCP: Isaac Bliss, Rayford Halsted, MD   Assessment & Plan: Visit Diagnoses:  1. Finger pain, right   2. Chronic left shoulder pain     Plan: Impression is improving left shoulder and right hand pain.  After discussion patient will continue with home exercises.  She declined a repeat shoulder injection today which I think is very reasonable.  She is progressing well.  We will see her back as needed.  Follow-Up Instructions: Return if symptoms worsen or fail to improve.   Orders:  No orders of the defined types were placed in this encounter.  No orders of the defined types were placed in this encounter.     Procedures: No procedures performed   Clinical Data: No additional findings.   Subjective: Chief Complaint  Patient presents with  . Left Shoulder - Pain  . Right Hand - Pain    [place] is following up today for her left shoulder pain and right hand pain.  She states overall those are doing much better.  The previous cortisone injection has really improved her pain has helped facilitate physical therapy.  She only has mild discomfort with the right hand and left shoulder.  She feels that both are very functional at this point.   Review of Systems   Objective: Vital Signs: There were no vitals taken for this visit.  Physical Exam  Ortho Exam Left shoulder exam shows mild limitation range of motion with mild pain. Specialty Comments:  No specialty comments available.  Imaging: No results found.   PMFS History: Patient Active Problem List   Diagnosis Date Noted  . Screening for cervical cancer 11/19/2017  . Routine general medical examination at a health care facility 11/27/2016  . Cervical polyp 11/03/2014  .  Encounter for routine gynecological examination 08/27/2011  . Vaginitis, atrophic 08/14/2011  . Contact dermatitis 03/02/2008  . Essential hypertension 05/29/2007  . Allergic rhinitis 05/29/2007  . URINARY INCONTINENCE 05/29/2007   Past Medical History:  Diagnosis Date  . Allergic eye reaction   . Allergic rhinitis   . Asthma   . Hair loss   . HTN (hypertension)   . PMS (premenstrual syndrome)   . Rectal tenesmus   . Urinary incontinence 2005    Family History  Problem Relation Age of Onset  . Alcohol abuse Other   . Diabetes Other   . Hypertension Other   . Prostate cancer Other   . Colon cancer Neg Hx     Past Surgical History:  Procedure Laterality Date  . ABDOMINAL SURGERY  2006   Tummy Tuck  . AUGMENTATION MAMMAPLASTY Bilateral 2007  . BREAST ENHANCEMENT SURGERY  2006   Saline implants  . CATARACT EXTRACTION Left 10/28/2013  . LIPOSUCTION  2006  . OTHER SURGICAL HISTORY     CB X3  . TUBAL LIGATION  1985   Social History   Occupational History  . Not on file  Tobacco Use  . Smoking status: Never Smoker  . Smokeless tobacco: Never Used  Substance and Sexual Activity  . Alcohol use: Yes    Alcohol/week: 1.0 standard drinks  Types: 1 drink(s) per week  . Drug use: No  . Sexual activity: Not on file

## 2019-12-17 ENCOUNTER — Ambulatory Visit: Payer: Medicare PPO

## 2019-12-17 ENCOUNTER — Other Ambulatory Visit: Payer: Self-pay

## 2019-12-17 DIAGNOSIS — M25512 Pain in left shoulder: Secondary | ICD-10-CM | POA: Diagnosis not present

## 2019-12-17 DIAGNOSIS — M6281 Muscle weakness (generalized): Secondary | ICD-10-CM

## 2019-12-17 DIAGNOSIS — M25612 Stiffness of left shoulder, not elsewhere classified: Secondary | ICD-10-CM

## 2019-12-17 DIAGNOSIS — G8929 Other chronic pain: Secondary | ICD-10-CM | POA: Diagnosis not present

## 2019-12-17 NOTE — Therapy (Signed)
[place], [place], [address] Phone: [phone]   Fax:  [phone]  Physical Therapy Treatment/Discharge  Patient Details  Name: Wendy Salazar MRN: [id number] Date of Birth: 09/23/1946 Referring Provider (PT): Dwana Melena , PA-c   Encounter Date: 12/17/2019  PT End of Session - 12/17/19 1453    Visit Number  7    Number of Visits  12    Date for PT Re-Evaluation  01/01/20    Authorization Type  [employer] MCR    PT Start Time  2229   late   PT Stop Time  0331    PT Time Calculation (min)  38 min    Activity Tolerance  Patient tolerated treatment well    Behavior During Therapy  [facility] for tasks assessed/performed       Past Medical History:  Diagnosis Date  . Allergic eye reaction   . Allergic rhinitis   . Asthma   . Hair loss   . HTN (hypertension)   . PMS (premenstrual syndrome)   . Rectal tenesmus   . Urinary incontinence 2005    Past Surgical History:  Procedure Laterality Date  . ABDOMINAL SURGERY  2006   Tummy Tuck  . AUGMENTATION MAMMAPLASTY Bilateral 2007  . BREAST ENHANCEMENT SURGERY  2006   Saline implants  . CATARACT EXTRACTION Left 10/28/2013  . LIPOSUCTION  2006  . OTHER SURGICAL HISTORY     CB X3  . TUBAL LIGATION  1985    There were no vitals filed for this visit.  Subjective Assessment - 12/17/19 1456    Subjective  No pain.   Can sleep on LT side now.   Using weights now at home.   PAin now with over exertion.  Can turn sterring wheel again Lt arm.    Currently in Pain?  No/denies                       [facility] Adult PT Treatment/Exercise - 12/17/19 0001      Shoulder Exercises: Standing   External Rotation  AROM;Strengthening;Left;20 reps    Theraband Level (Shoulder External Rotation)  Level 4 (Blue)    Internal Rotation  AROM;Strengthening;Left;20 reps    Theraband Level (Shoulder Internal Rotation)  Level 4 (Blue)    Flexion   AROM;Strengthening;Left;20 reps    Theraband Level (Shoulder Flexion)  Level 4 (Blue)    Flexion Limitations  punches    Extension  AROM;Strengthening;Both;20 reps    Theraband Level (Shoulder Extension)  Level 4 (Blue)    Row  AROM;Strengthening;Both;20 reps    Theraband Level (Shoulder Row)  Level 4 (Blue)      Shoulder Exercises: ROM/Strengthening   UBE (Upper Arm Bike)  L2.5   x 6 min with 3 min ea. fw/rev             PT Education - 12/17/19 1540    Education Details  HEP    Person(s) Educated  Patient    Methods  Explanation;Demonstration;Tactile cues;Verbal cues;Handout    Comprehension  Verbalized understanding;Returned demonstration       PT Short Term Goals - 12/17/19 1543      PT SHORT TERM GOAL #1   Title  She will be indpendent with initial HEP    Status  Achieved      PT SHORT TERM GOAL #2   Title  She will report pain decreased 25% or more    Status  Achieved  PT SHORT TERM GOAL #3   Title  AROM of    Lt shoulder  will increase  to   equal RT shoulder    Baseline  improved but abduction still significantly limited active    Status  Not Met        PT Long Term Goals - 12/17/19 1544      PT LONG TERM GOAL #1   Title  She will be indpendent with all hEP issued    Status  Achieved      PT LONG TERM GOAL #2   Title  She will report  pain 50% improved with use at home tasks    Status  Achieved      PT LONG TERM GOAL #3   Title  She will reurn to normal use of TL arm for home tasks with cleaning and cooking.    Status  Achieved      PT LONG TERM GOAL #4   Title  She will report able to push basket in shopping with no pain    Status  Achieved      PT LONG TERM GOAL #5   Title  FOTO score will improve to 40% limited or better  to demo percieved functional improvement    Status  Achieved            Plan - 12/17/19 1454    Clinical Impression Statement  Pt feels she can progress herself at home  so she agrees to discharge.    PT  Treatment/Interventions  Cryotherapy;Electrical Stimulation;Iontophoresis 76m/ml Dexamethasone;Moist Heat;Ultrasound;Therapeutic activities;Therapeutic exercise;Manual techniques;Patient/family education;Passive range of motion;Taping    PT Next Visit Plan  Discharge with HEP    PT Home Exercise Plan  Forward wall slide, active reaching behind back and above head, IR/ ER and rows with red band, seated flexion , abduction , scaption bilateral , hold in space  5-10 sec, issued blue band    Consulted and Agree with Plan of Care  Patient       Patient will benefit from skilled therapeutic intervention in order to improve the following deficits and impairments:  Pain, Decreased activity tolerance, Decreased strength, Decreased range of motion  Visit Diagnosis: Stiffness of left shoulder, not elsewhere classified  Muscle weakness (generalized)     Problem List Patient Active Problem List   Diagnosis Date Noted  . Screening for cervical cancer 11/19/2017  . Routine general medical examination at a health care facility 11/27/2016  . Cervical polyp 11/03/2014  . Encounter for routine gynecological examination 08/27/2011  . Vaginitis, atrophic 08/14/2011  . Contact dermatitis 03/02/2008  . Essential hypertension 05/29/2007  . Allergic rhinitis 05/29/2007  . URINARY INCONTINENCE 05/29/2007    CDarrel Hoover PT 12/17/2019, 3:45 PM  [facility] [address]Phone: [phone]  Fax:  [phone] Name: VMAURICA OMURAMRN: [id number] of Birth: [birth year] PHYSICAL THERAPY DISCHARGE SUMMARY  Visits from Start of Care: 7  Current functional level related to goals / functional outcomes: See above   Remaining deficits: See above   Education / Equipment: HEP Plan: Patient agrees to discharge.  Patient goals were not met. Patient is being discharged due to being pleased with the current  functional level.  ?????    SPearson ForsterPT

## 2019-12-17 NOTE — Patient Instructions (Addendum)
Seated with weight flexion , abduction , scaption  Cued to not elevate shoulder, daily  10-15 reps hold 1-5 sec                           assist for placement in space hole for 5-10 sec x 10 reps

## 2020-03-21 ENCOUNTER — Encounter (HOSPITAL_COMMUNITY): Payer: Self-pay

## 2020-03-21 ENCOUNTER — Other Ambulatory Visit: Payer: Self-pay

## 2020-03-21 ENCOUNTER — Ambulatory Visit (HOSPITAL_COMMUNITY)
Admission: EM | Admit: 2020-03-21 | Discharge: 2020-03-21 | Disposition: A | Discharge status: Home / Self Care | Payer: Medicare PPO | Attending: Urgent Care | Admitting: Urgent Care

## 2020-03-21 DIAGNOSIS — J9801 Acute bronchospasm: Secondary | ICD-10-CM

## 2020-03-21 DIAGNOSIS — R05 Cough: Secondary | ICD-10-CM

## 2020-03-21 DIAGNOSIS — J4520 Mild intermittent asthma, uncomplicated: Secondary | ICD-10-CM

## 2020-03-21 DIAGNOSIS — R059 Cough, unspecified: Secondary | ICD-10-CM

## 2020-03-21 DIAGNOSIS — R062 Wheezing: Secondary | ICD-10-CM

## 2020-03-21 MED ORDER — BENZONATATE 100 MG PO CAPS: 100.0000 mg | ORAL_CAPSULE | Freq: Three times a day (TID) | ORAL | 0 refills | Status: DC | PRN

## 2020-03-21 MED ORDER — ALBUTEROL SULFATE HFA 108 (90 BASE) MCG/ACT IN AERS: 1.0000 | INHALATION_SPRAY | Freq: Four times a day (QID) | RESPIRATORY_TRACT | 0 refills | Status: DC | PRN

## 2020-03-21 MED ORDER — PROMETHAZINE-DM 6.25-15 MG/5ML PO SYRP: 5.0000 mL | ORAL_SOLUTION | Freq: Every evening | ORAL | 0 refills | Status: DC | PRN

## 2020-03-21 MED ORDER — PREDNISONE 20 MG PO TABS: ORAL_TABLET | ORAL | 0 refills | Status: DC

## 2020-03-21 NOTE — ED Provider Notes (Signed)
[place]   MRN: [id number] DOB: [birth year]  Subjective:   Wendy Salazar is a 74 y.o. female presenting for 3-week history of persistent but intermittent cough.  Patient states that any kind of respiratory irritant gets her in a coughing spell.  She sometimes has wheezing at night.  She has a history of asthma but does not have an active inhaler now.  She does have a history of pulmonary fibrosis.  Denies history of lung cancer, sarcoidosis, smoking.  Patient has had her Covid vaccination, last dose was in February.  No current facility-administered medications for this encounter.  Current Outpatient Medications:  .  Calcium-Vitamin D-Vitamin K (CALCIUM + D + K PO), Take by mouth daily.  , Disp: , Rfl:  .  cetirizine (ZYRTEC) 10 MG tablet, Take 10 mg by mouth daily as needed.  , Disp: , Rfl:  .  conjugated estrogens (PREMARIN) vaginal cream, Place vaginally once a week., Disp: 42.5 g, Rfl: 11 .  Docusate Calcium (STOOL SOFTENER PO), Take by mouth as needed., Disp: , Rfl:  .  fluocinonide cream (LIDEX) 0.05 %, APPLY ONE APPLICATION TOPICALLY TWO TIMES DAILY AS NEEDED, Disp: 60 g, Rfl: 3 .  fluticasone (FLONASE) 50 MCG/ACT nasal spray, Place 2 sprays into both nostrils daily as needed., Disp: 32 g, Rfl: 11 .  Hyprom-Naphaz-Polysorb-Zn Sulf (CLEAR EYES COMPLETE OP), Apply to eye., Disp: , Rfl:  .  MELATONIN ER PO, Take by mouth as needed., Disp: , Rfl:  .  triamterene-hydrochlorothiazide (MAXZIDE) 75-50 MG tablet, One half tab every morning, Disp: 100 tablet, Rfl: 1   Allergies  Allergen Reactions  . Penicillins     REACTION: hives    Past Medical History:  Diagnosis Date  . Allergic eye reaction   . Allergic rhinitis   . Asthma   . Hair loss   . HTN (hypertension)   . PMS (premenstrual syndrome)   . Rectal tenesmus   . Urinary incontinence 2005     Past Surgical History:  Procedure Laterality Date  . ABDOMINAL SURGERY  2006   Tummy Tuck  . AUGMENTATION  MAMMAPLASTY Bilateral 2007  . BREAST ENHANCEMENT SURGERY  2006   Saline implants  . CATARACT EXTRACTION Left 10/28/2013  . LIPOSUCTION  2006  . OTHER SURGICAL HISTORY     CB X3  . TUBAL LIGATION  1985    Family History  Problem Relation Age of Onset  . Alcohol abuse Other   . Diabetes Other   . Hypertension Other   . Prostate cancer Other   . Colon cancer Neg Hx     Social History   Tobacco Use  . Smoking status: Never Smoker  . Smokeless tobacco: Never Used  Substance Use Topics  . Alcohol use: Yes    Alcohol/week: 1.0 standard drinks    Types: 1 drink(s) per week  . Drug use: No    Review of Systems  Constitutional: Negative for fever and malaise/fatigue.  HENT: Negative for congestion, ear pain, sinus pain and sore throat.   Eyes: Negative for discharge and redness.  Respiratory: Positive for cough and wheezing. Negative for hemoptysis and shortness of breath.   Cardiovascular: Positive for chest pain (occasionally with coughing fits only).  Gastrointestinal: Negative for abdominal pain, diarrhea, nausea and vomiting.  Genitourinary: Negative for dysuria, flank pain and hematuria.  Musculoskeletal: Negative for myalgias.  Skin: Negative for rash.  Neurological: Negative for dizziness, weakness and headaches.  Psychiatric/Behavioral: Negative for depression and substance abuse.  Objective:   Vitals: BP 136/67 (BP Location: Right Arm)   Pulse 77   Temp 98.5 F (36.9 C) (Oral)   Resp 19   SpO2 100%   Physical Exam Constitutional:      General: She is not in acute distress.    Appearance: Normal appearance. She is well-developed. She is not ill-appearing, toxic-appearing or diaphoretic.  HENT:     Head: Normocephalic and atraumatic.     Nose: Nose normal.     Mouth/Throat:     Mouth: Mucous membranes are moist.  Eyes:     Extraocular Movements: Extraocular movements intact.     Pupils: Pupils are equal, round, and reactive to light.  Cardiovascular:      Rate and Rhythm: Normal rate and regular rhythm.     Pulses: Normal pulses.     Heart sounds: Normal heart sounds. No murmur. No friction rub. No gallop.   Pulmonary:     Effort: Pulmonary effort is normal. No respiratory distress.     Breath sounds: Normal breath sounds. No stridor. No wheezing, rhonchi or rales.  Skin:    General: Skin is warm and dry.     Findings: No rash.  Neurological:     Mental Status: She is alert and oriented to person, place, and time.  Psychiatric:        Mood and Affect: Mood normal.        Behavior: Behavior normal.        Thought Content: Thought content normal.        Judgment: Judgment normal.      Assessment and Plan :   PDMP not reviewed this encounter.  1. Cough   2. Bronchospasm, acute   3. Mild intermittent asthma without complication   4. Wheezing     Patient has clear lung sounds on exam, 100% pulse oximetry.  She declined x-ray.  Will have patient start prednisone for 5 days, refilled her albuterol inhaler.  Low suspicion for COVID-19 given reassuring vital signs, no fever or shortness of breath, malaise, cough suppression medications.  Recommended patient follow-up with her PCP, pulmonologist.  If there is no improvement counseled patient that chest x-ray would be necessary at that point. Counseled patient on potential for adverse effects with medications prescribed/recommended today, ER and return-to-clinic precautions discussed, patient verbalized understanding.    Jaynee Eagles, PA-C 03/21/20 1425

## 2020-03-21 NOTE — ED Triage Notes (Addendum)
Pt reports dry cough x 3 weeks.Cough is worse at night or if is hot outside during the day. Pt states this is related to her asthma. Pt have not used an inhaler or nebulizer treatments as she do not have any. Pt denies any shortness of breath.

## 2020-05-05 ENCOUNTER — Other Ambulatory Visit: Payer: Self-pay | Admitting: Family Medicine

## 2020-06-09 ENCOUNTER — Telehealth (INDEPENDENT_AMBULATORY_CARE_PROVIDER_SITE_OTHER): Payer: Medicare PPO | Admitting: Family Medicine

## 2020-06-09 ENCOUNTER — Encounter: Payer: Self-pay | Admitting: Family Medicine

## 2020-06-09 ENCOUNTER — Telehealth: Payer: Medicare PPO | Admitting: Family Medicine

## 2020-06-09 ENCOUNTER — Other Ambulatory Visit: Payer: Self-pay

## 2020-06-09 VITALS — Temp 96.9°F | Wt 157.0 lb

## 2020-06-09 DIAGNOSIS — J4520 Mild intermittent asthma, uncomplicated: Secondary | ICD-10-CM | POA: Diagnosis not present

## 2020-06-09 DIAGNOSIS — K219 Gastro-esophageal reflux disease without esophagitis: Secondary | ICD-10-CM | POA: Diagnosis not present

## 2020-06-09 DIAGNOSIS — R05 Cough: Secondary | ICD-10-CM

## 2020-06-09 DIAGNOSIS — R059 Cough, unspecified: Secondary | ICD-10-CM

## 2020-06-09 DIAGNOSIS — J309 Allergic rhinitis, unspecified: Secondary | ICD-10-CM

## 2020-06-09 DIAGNOSIS — R0982 Postnasal drip: Secondary | ICD-10-CM

## 2020-06-09 MED ORDER — ALBUTEROL SULFATE HFA 108 (90 BASE) MCG/ACT IN AERS: 1.0000 | INHALATION_SPRAY | Freq: Four times a day (QID) | RESPIRATORY_TRACT | 0 refills | Status: DC | PRN

## 2020-06-09 MED ORDER — BENZONATATE 100 MG PO CAPS
100.0000 mg | ORAL_CAPSULE | Freq: Three times a day (TID) | ORAL | 0 refills | Status: DC | PRN
Start: 2020-06-09 — End: 2020-08-11

## 2020-06-09 NOTE — Progress Notes (Signed)
Virtual Visit via Video Note  I connected with Wendy Salazar  on 06/09/20 at  3:00 PM EDT by a video enabled telemedicine application and verified that I am speaking with the correct person using two identifiers.  Location patient: home, [place] Location provider:work or home office Persons participating in the virtual visit: patient, provider  I discussed the limitations of evaluation and management by telemedicine and the availability of in person appointments. The patient expressed understanding and agreed to proceed.   HPI:  Acute visit for a cough and sinus drainage: -chronic, reports for at least 3 months -clear nasal congestion constantly, PND, cough- mainly at night,sometimes wheezing - but has not had this since May -has albuterol - uses about 1-2x  per week for cough, seems to help when she uses it, reports history of asthma -denies fevers, night sweats, weight loss, SOB, CP, weight loss, hemoptysis, acid reflux -sporadically take allegra or zyrtec as reports has bad allergic rhinitis -reports also sometimes has acid reflux at night -fully vaccinated for covid19   ROS: See pertinent positives and negatives per HPI.  Past Medical History:  Diagnosis Date  . Allergic eye reaction   . Allergic rhinitis   . Asthma   . Hair loss   . HTN (hypertension)   . PMS (premenstrual syndrome)   . Rectal tenesmus   . Urinary incontinence 2005    Past Surgical History:  Procedure Laterality Date  . ABDOMINAL SURGERY  2006   Tummy Tuck  . AUGMENTATION MAMMAPLASTY Bilateral 2007  . BREAST ENHANCEMENT SURGERY  2006   Saline implants  . CATARACT EXTRACTION Left 10/28/2013  . LIPOSUCTION  2006  . OTHER SURGICAL HISTORY     CB X3  . TUBAL LIGATION  1985    Family History  Problem Relation Age of Onset  . Alcohol abuse Other   . Diabetes Other   . Hypertension Other   . Prostate cancer Other   . Colon cancer Neg Hx     SOCIAL HX: see hpi   Current Outpatient Medications:  .   albuterol (VENTOLIN HFA) 108 (90 Base) MCG/ACT inhaler, Inhale 1-2 puffs into the lungs every 6 (six) hours as needed for wheezing or shortness of breath., Disp: 18 g, Rfl: 0 .  Calcium-Vitamin D-Vitamin K (CALCIUM + D + K PO), Take by mouth daily.  , Disp: , Rfl:  .  cetirizine (ZYRTEC) 10 MG tablet, Take 10 mg by mouth daily as needed.  , Disp: , Rfl:  .  conjugated estrogens (PREMARIN) vaginal cream, Place vaginally once a week., Disp: 42.5 g, Rfl: 11 .  Docusate Calcium (STOOL SOFTENER PO), Take by mouth as needed., Disp: , Rfl:  .  fluocinonide cream (LIDEX) 0.05 %, APPLY ONE APPLICATION TOPICALLY TWO TIMES DAILY AS NEEDED, Disp: 60 g, Rfl: 3 .  fluticasone (FLONASE) 50 MCG/ACT nasal spray, Place 2 sprays into both nostrils daily as needed., Disp: 32 g, Rfl: 11 .  Hyprom-Naphaz-Polysorb-Zn Sulf (CLEAR EYES COMPLETE OP), Apply to eye., Disp: , Rfl:  .  MELATONIN ER PO, Take by mouth as needed., Disp: , Rfl:  .  promethazine-dextromethorphan (PROMETHAZINE-DM) 6.25-15 MG/5ML syrup, Take 5 mLs by mouth at bedtime as needed for cough., Disp: 100 mL, Rfl: 0 .  triamterene-hydrochlorothiazide (MAXZIDE) 75-50 MG tablet, TAKE ONE HALF TAB EVERY MORNING, Disp: 45 tablet, Rfl: 4 .  benzonatate (TESSALON PERLES) 100 MG capsule, Take 1 capsule (100 mg total) by mouth 3 (three) times daily as needed., Disp: 20 capsule, Rfl:  0  EXAM:  VITALS per patient if applicable:  GENERAL: alert, oriented, appears well and in no acute distress  HEENT: atraumatic, conjunttiva clear, no obvious abnormalities on inspection of external nose and ears  NECK: normal movements of the head and neck  LUNGS: on inspection no signs of respiratory distress, breathing rate appears normal, no obvious gross SOB, gasping or wheezing  CV: no obvious cyanosis  MS: moves all visible extremities without noticeable abnormality  PSYCH/NEURO: pleasant and cooperative, no obvious depression or anxiety, speech and thought processing  grossly intact  ASSESSMENT AND PLAN:  Discussed the following assessment and plan:  Cough -chronic -many potential etiologies, common causes of chronic cough include PND, AR, GERD. She also has asthma. -opted for trial treating two most common causes one at a time to see if helps and close follow up with PCP in 2-4 weeks advised. May need to see pulm if not improving. -tessalon prn short term per instructions  Allergic rhinitis, unspecified seasonality, unspecified trigger -start with trial allegra once daily and flonase 2 sprays each nostril daily for 2 weeks.   PND (post-nasal drip) -could be from [place] or GERD, see these issues for tx recs  Gastroesophageal reflux disease, unspecified whether esophagitis present -if cough not improved with treating allergies, advised 2 week short course of PPI, can also do dietary changes - see pat instructions  Mild intermittent asthma, unspecified whether complicated -refilled alb -if other tx above for AR/GERD no working she may need PFTs, step up in therapy for asthma, pulm eval -follow up with PCP in 2-4 weeks  -we discussed possible serious and likely etiologies, options for evaluation and workup, limitations of telemedicine visit vs in person visit, treatment, treatment risks and precautions. Pt prefers to treat via telemedicine empirically rather then risking or undertaking an in person visit at this moment. Has a number of chronic issues going on that are untreated and could be contributing to the chronic cough. See above for problem specific summary. Work/School slipped offered: Advised to seek prompt follow up telemedicine visit or in person care if worsening, new symptoms arise, or if is not improving with treatment. Did let her know that I only do telemedicine on Tuesdays and Thursdays for [place] and advised follow up visit with PCP or [facility] if needs follow up or if any further questions arise to avoid any delays.   I discussed the  assessment and treatment plan with the patient. The patient was provided an opportunity to ask questions and all were answered. The patient agreed with the plan and demonstrated an understanding of the instructions.   The patient was advised to call back or seek an in-person evaluation if the symptoms worsen or if the condition fails to improve as anticipated.   Lucretia Kern, DO

## 2020-06-09 NOTE — Patient Instructions (Signed)
-  I sent the medication(s) we discussed to your pharmacy: Meds ordered this encounter  Medications   benzonatate (TESSALON PERLES) 100 MG capsule    Sig: Take 1 capsule (100 mg total) by mouth 3 (three) times daily as needed.    Dispense:  20 capsule    Refill:  0   albuterol (VENTOLIN HFA) 108 (90 Base) MCG/ACT inhaler    Sig: Inhale 1-2 puffs into the lungs every 6 (six) hours as needed for wheezing or shortness of breath.    Dispense:  18 g    Refill:  0    Start with allegra once daily and flonase 2 sprays each nostril daily for 2 weeks.  If cough does not resolve start prilosec or nexium once daily for 2 weeks.  Follow up with an appointment with your primary care doctor in 2-4 weeks. If treating your allergies and acid does not result in resolution of you cough you may need to see a lung specialist and have further tests done on your lungs such as a chest xray and breathing studies.   I hope you are feeling better soon! Seek care sooner f your symptoms worsen or new concerns arise.

## 2020-06-09 NOTE — Progress Notes (Signed)
    Called both numbers in chart and send telemedicine link at the time of her appointment. LM for patient to call back if still needs appt or to let us know if not, as I was unable to reach her for her scheduled telemedicine visit.

## 2020-06-12 DIAGNOSIS — Z20828 Contact with and (suspected) exposure to other viral communicable diseases: Secondary | ICD-10-CM | POA: Diagnosis not present

## 2020-06-13 DIAGNOSIS — H40003 Preglaucoma, unspecified, bilateral: Secondary | ICD-10-CM | POA: Diagnosis not present

## 2020-06-14 ENCOUNTER — Telehealth: Payer: Self-pay | Admitting: Family Medicine

## 2020-06-14 DIAGNOSIS — R059 Cough, unspecified: Secondary | ICD-10-CM

## 2020-06-14 DIAGNOSIS — J4520 Mild intermittent asthma, uncomplicated: Secondary | ICD-10-CM

## 2020-06-14 NOTE — Telephone Encounter (Signed)
Pt is calling in stating that she would like to have a referral to a pulmonologist due to her still having the dry cough.

## 2020-06-14 NOTE — Telephone Encounter (Signed)
Referral placed.

## 2020-06-22 ENCOUNTER — Telehealth (INDEPENDENT_AMBULATORY_CARE_PROVIDER_SITE_OTHER): Payer: Medicare PPO | Admitting: Internal Medicine

## 2020-06-22 ENCOUNTER — Other Ambulatory Visit: Payer: Self-pay

## 2020-06-22 DIAGNOSIS — R059 Cough, unspecified: Secondary | ICD-10-CM

## 2020-06-22 DIAGNOSIS — R05 Cough: Secondary | ICD-10-CM

## 2020-06-22 MED ORDER — PANTOPRAZOLE SODIUM 40 MG PO TBEC: 40.0000 mg | DELAYED_RELEASE_TABLET | Freq: Every day | ORAL | 0 refills | Status: DC

## 2020-06-22 NOTE — Progress Notes (Signed)
Virtual Visit via Telephone Note  I connected with Wendy Salazar on 06/22/20 at  3:45 PM EDT by telephone and verified that I am speaking with the correct person using two identifiers.   I discussed the limitations, risks, security and privacy concerns of performing an evaluation and management service by telephone and the availability of in person appointments. I also discussed with the patient that there may be a patient responsible charge related to this service. The patient expressed understanding and agreed to proceed.  Location patient: home Location provider: work office Participants present for the call: patient, provider Patient did not have a visit in the prior 7 days to address this/these issue(s).   History of Present Illness:  She has scheduled this visit to discuss a continued, chronic cough that has been present since April.  She has a history of asthma and allergic rhinitis.  She has tried [employer] and takes daily antihistamines without relief.  She is a non-smoker.  She feels like cold weather makes her cough worse.  She has been prescribed Flonase and albuterol without relief.  She particularly states that the cough is very bothersome at nighttime.  She would like a chest x-ray.   Observations/Objective: Patient sounds cheerful and well on the phone. I do not appreciate any increased work of breathing. Speech and thought processing are grossly intact. Patient reported vitals: None reported   Current Outpatient Medications:  .  albuterol (VENTOLIN HFA) 108 (90 Base) MCG/ACT inhaler, Inhale 1-2 puffs into the lungs every 6 (six) hours as needed for wheezing or shortness of breath., Disp: 18 g, Rfl: 0 .  benzonatate (TESSALON PERLES) 100 MG capsule, Take 1 capsule (100 mg total) by mouth 3 (three) times daily as needed., Disp: 20 capsule, Rfl: 0 .  Calcium-Vitamin D-Vitamin K (CALCIUM + D + K PO), Take by mouth daily.  , Disp: , Rfl:  .  cetirizine (ZYRTEC)  10 MG tablet, Take 10 mg by mouth daily as needed.  , Disp: , Rfl:  .  conjugated estrogens (PREMARIN) vaginal cream, Place vaginally once a week., Disp: 42.5 g, Rfl: 11 .  Docusate Calcium (STOOL SOFTENER PO), Take by mouth as needed., Disp: , Rfl:  .  fluocinonide cream (LIDEX) 0.05 %, APPLY ONE APPLICATION TOPICALLY TWO TIMES DAILY AS NEEDED, Disp: 60 g, Rfl: 3 .  fluticasone (FLONASE) 50 MCG/ACT nasal spray, Place 2 sprays into both nostrils daily as needed., Disp: 32 g, Rfl: 11 .  Hyprom-Naphaz-Polysorb-Zn Sulf (CLEAR EYES COMPLETE OP), Apply to eye., Disp: , Rfl:  .  MELATONIN ER PO, Take by mouth as needed., Disp: , Rfl:  .  promethazine-dextromethorphan (PROMETHAZINE-DM) 6.25-15 MG/5ML syrup, Take 5 mLs by mouth at bedtime as needed for cough., Disp: 100 mL, Rfl: 0 .  triamterene-hydrochlorothiazide (MAXZIDE) 75-50 MG tablet, TAKE ONE HALF TAB EVERY MORNING, Disp: 45 tablet, Rfl: 4 .  pantoprazole (PROTONIX) 40 MG tablet, Take 1 tablet (40 mg total) by mouth daily., Disp: 90 tablet, Rfl: 0  Review of Systems:  Constitutional: Denies fever, chills, diaphoresis, appetite change and fatigue.  HEENT: Denies photophobia, eye pain, redness, hearing loss, ear pain, congestion, sore throat, rhinorrhea, sneezing, mouth sores, trouble swallowing, neck pain, neck stiffness and tinnitus.   Respiratory: Denies SOB, DOE, cough, chest tightness,  and wheezing.   Cardiovascular: Denies chest pain, palpitations and leg swelling.  Gastrointestinal: Denies nausea, vomiting, abdominal pain, diarrhea, constipation, blood in stool and abdominal distention.  Genitourinary: Denies dysuria, urgency, frequency,  hematuria, flank pain and difficulty urinating.  Endocrine: Denies: hot or cold intolerance, sweats, changes in hair or nails, polyuria, polydipsia. Musculoskeletal: Denies myalgias, back pain, joint swelling, arthralgias and gait problem.  Skin: Denies pallor, rash and wound.  Neurological: Denies  dizziness, seizures, syncope, weakness, light-headedness, numbness and headaches.  Hematological: Denies adenopathy. Easy bruising, personal or family bleeding history  Psychiatric/Behavioral: Denies suicidal ideation, mood changes, confusion, nervousness, sleep disturbance and agitation   Assessment and Plan:  Cough  -Since cough is more bothersome at nighttime, I wonder if this may be atypical reflux. -Have advised Protonix 40 mg daily, prescription sent. -I agree with chest x-ray due to chronicity of cough. -If no improvement with PPI therapy after 4-8 weeks, consider referral to pulmonary for PFTs, she does have a diagnosis of asthma in her chart.  We could also consider adding a daily inhaled corticosteroid to uptitrate her asthma management since as needed inhaled beta agonist alone is not particularly helpful.    I discussed the assessment and treatment plan with the patient. The patient was provided an opportunity to ask questions and all were answered. The patient agreed with the plan and demonstrated an understanding of the instructions.   The patient was advised to call back or seek an in-person evaluation if the symptoms worsen or if the condition fails to improve as anticipated.  I provided 22 minutes of non-face-to-face time during this encounter.   Lelon Frohlich, MD [facility]

## 2020-07-01 ENCOUNTER — Other Ambulatory Visit: Payer: Self-pay | Admitting: Family Medicine

## 2020-07-05 ENCOUNTER — Institutional Professional Consult (permissible substitution): Payer: Medicare PPO | Admitting: Pulmonary Disease

## 2020-07-05 DIAGNOSIS — K219 Gastro-esophageal reflux disease without esophagitis: Secondary | ICD-10-CM | POA: Diagnosis not present

## 2020-07-05 DIAGNOSIS — R05 Cough: Secondary | ICD-10-CM | POA: Diagnosis not present

## 2020-07-05 DIAGNOSIS — R053 Chronic cough: Secondary | ICD-10-CM | POA: Insufficient documentation

## 2020-07-06 ENCOUNTER — Other Ambulatory Visit: Payer: Self-pay | Admitting: Physician Assistant

## 2020-07-06 ENCOUNTER — Ambulatory Visit
Admission: RE | Admit: 2020-07-06 | Discharge: 2020-07-06 | Disposition: A | Discharge status: Home / Self Care | Payer: Medicare PPO | Source: Ambulatory Visit | Attending: Physician Assistant | Admitting: Physician Assistant

## 2020-07-06 ENCOUNTER — Other Ambulatory Visit: Payer: Self-pay

## 2020-07-06 DIAGNOSIS — R05 Cough: Secondary | ICD-10-CM | POA: Diagnosis not present

## 2020-07-06 DIAGNOSIS — R053 Chronic cough: Secondary | ICD-10-CM

## 2020-08-02 ENCOUNTER — Telehealth: Payer: Self-pay | Admitting: Internal Medicine

## 2020-08-02 NOTE — Telephone Encounter (Signed)
The patient called to cancel her CPE appointment for 08/04/2020 and I advised the patient that Jerilee Hoh is scheduled out for CPE to December /January. The patient wanted to speak to Dr. Jerilee Hoh nurse and I advised her that they are out of the office till Wednesday. She wants to speak to the nurse to be seen sooner than December/January. She was suppose to have her CPE in February but was pushed out because Dr. Jerilee Hoh was working at [facility] and Dr. Jerilee Hoh told her that she should have been pushed out this far for her CPE.  Please advise

## 2020-08-04 ENCOUNTER — Encounter: Payer: Medicare PPO | Admitting: Internal Medicine

## 2020-08-09 NOTE — Telephone Encounter (Signed)
[place] for work in

## 2020-08-09 NOTE — Telephone Encounter (Signed)
Would you like to work her in?

## 2020-08-10 ENCOUNTER — Other Ambulatory Visit: Payer: Self-pay | Admitting: Family Medicine

## 2020-08-10 NOTE — Telephone Encounter (Signed)
LMVM for the patient to contact the office to confirm the CPE appointment that I scheduled her with Dr. Jerilee Hoh on 08/25/2020 at 10 AM.

## 2020-08-25 ENCOUNTER — Encounter: Payer: Medicare PPO | Admitting: Internal Medicine

## 2020-09-20 ENCOUNTER — Other Ambulatory Visit: Payer: Self-pay | Admitting: Internal Medicine

## 2020-09-29 ENCOUNTER — Encounter: Payer: Self-pay | Admitting: Pulmonary Disease

## 2020-09-29 ENCOUNTER — Other Ambulatory Visit: Payer: Self-pay

## 2020-09-29 ENCOUNTER — Ambulatory Visit: Payer: Medicare PPO | Admitting: Pulmonary Disease

## 2020-09-29 VITALS — BP 116/68 | HR 67 | Temp 97.2°F | Ht 65.0 in | Wt 150.2 lb

## 2020-09-29 DIAGNOSIS — R059 Cough, unspecified: Secondary | ICD-10-CM | POA: Diagnosis not present

## 2020-09-29 DIAGNOSIS — J301 Allergic rhinitis due to pollen: Secondary | ICD-10-CM

## 2020-09-29 DIAGNOSIS — J4520 Mild intermittent asthma, uncomplicated: Secondary | ICD-10-CM | POA: Diagnosis not present

## 2020-09-29 NOTE — Patient Instructions (Addendum)
Nice to meet you  No new medicines - maybe the tessalon perles and cough syrup reset a cough habit. I wonder too if asthma is to blame for the cough. Since things are better no need for more medicine for now. If cough returns, would recommend a different inhaler for presumed asthma.   Return to clinic as needed.

## 2020-09-29 NOTE — Progress Notes (Signed)
@Patient  ID: Wendy Salazar, female    DOB: 05-Aug-1946, 74 y.o.   MRN: [id number]  Chief Complaint  Patient presents with  . Cough    Persistent cough worse qhs after receiving second covid vaccine, in Feb. 2021.  Pt states her cough resolved X2 wks ago after starting benzonatate, but wanted to keep appt to ensure nothing further was happening.     Referring provider: Isaac Bliss, [place]*  HPI:   74 year old woman with past medical asthma whom are seen in consultation at the request of Rande Lawman crosscut MD for evaluation of cough.  Notes from referring provider reviewed.  Patient received a second dose of flu vaccine in February 2021 per her report this visit.  Shortly thereafter she had onset of cough.  Nonproductive.  Present throughout the day and night.  No clear timing in terms of better or worse.  No alleviating or exacerbating factors over the last several months.  She was started on PPI in the spring without significant improvement.  This was increased to twice daily at some point without any improvement.  Seems like she was seen by ENT throughout some of this.  Over the last few weeks, she started using dextromethorphan twice a day with some relief.  See message to ENT and mention that Tessalon Perles to improve her cough some months ago.  This was represcribed.  This subsequently eliminated the cough per her report.  She has not taken any Tessalon Perles or cough syrup for the last 2 weeks.  Uses PPI sparingly, as needed.  She has her asthma has been well controlled for some time.  She does indicate that she has a lot of atopic symptoms including rash that needs prednisone at times.  None recently.  Notes triggers when she would go to the facility and that would make her cough a bit worse on further questioning.  This included Clorox, other cleaning agents that seem to make her skin breakout as well as make her cough a bit worse.  She is avoided these environments over the  last several months.  Chest x-ray 07/06/2020 personally reviewed and interpreted as clear lungs, no infiltrate or effusions.  PMH: Hypertension, asthma Surgical history: Tubal ligation, breast augmentation Family history: Reviewed, denies respiratory illnesses in family Social history: Never smoker, owns facility for disabled individuals, lives in [place] / Pulmonary Flowsheets:   ACT:  No flowsheet data found.  MMRC: No flowsheet data found.  Epworth:  No flowsheet data found.  Tests:   FENO:  No results found for: NITRICOXIDE  PFT: No flowsheet data found.  WALK:  No flowsheet data found.  Imaging: Personally reviewed as per discussion in this note and as per EMR  Lab Results: Personally reviewed, most recent eosinophil value 100 in 2020 CBC    Component Value Date/Time   WBC 3.7 (L) 11/20/2018 1053   RBC 4.71 11/20/2018 1053   HGB 14.0 11/20/2018 1053   HCT 42.9 11/20/2018 1053   PLT 239.0 11/20/2018 1053   MCV 91.2 11/20/2018 1053   MCHC 32.7 11/20/2018 1053   RDW 13.4 11/20/2018 1053   LYMPHSABS 1.6 11/20/2018 1053   MONOABS 0.5 11/20/2018 1053   EOSABS 0.1 11/20/2018 1053   BASOSABS 0.0 11/20/2018 1053    BMET    Component Value Date/Time   NA 140 11/20/2018 1053   K 4.4 11/20/2018 1053   CL 101 11/20/2018 1053   CO2 34 (H) 11/20/2018 1053   GLUCOSE  87 11/20/2018 1053   BUN 15 11/20/2018 1053   CREATININE 0.75 11/20/2018 1053   CALCIUM 9.7 11/20/2018 1053   GFRNONAA 96.34 08/11/2010 1015   GFRAA 93 07/30/2008 0918    BNP No results found for: BNP  ProBNP No results found for: PROBNP  Specialty Problems      Pulmonary Problems   Allergic rhinitis    Qualifier: Diagnosis of  By: Scherrie Gerlach           Allergies  Allergen Reactions  . Penicillins     REACTION: hives    Immunization History  Administered Date(s) Administered  . Influenza Split 08/14/2011  . Influenza Whole 07/29/2007,  08/06/2008, 08/08/2009, 08/11/2010  . Influenza, High Dose Seasonal PF 08/06/2013, 08/16/2016, 07/04/2019  . Influenza,inj,Quad PF,6+ Mos 08/26/2018  . Influenza-Unspecified 08/04/2014, 08/23/2015  . PFIZER SARS-COV-2 Vaccination 11/10/2019, 12/01/2019  . Pneumococcal Conjugate-13 09/28/2013  . Pneumococcal Polysaccharide-23 08/08/2009  . Td 10/22/1997, 08/06/2008  . Tdap 11/20/2018, 05/18/2019  . Zoster 08/06/2013  . Zoster Recombinat (Shingrix) 12/25/2017, 03/25/2018    Past Medical History:  Diagnosis Date  . Allergic eye reaction   . Allergic rhinitis   . Asthma   . Hair loss   . HTN (hypertension)   . PMS (premenstrual syndrome)   . Rectal tenesmus   . Urinary incontinence 2005    Tobacco History: Social History   Tobacco Use  Smoking Status Never Smoker  Smokeless Tobacco Never Used   Counseling given: Not Answered   Continue to not smoke  Outpatient Encounter Medications as of 09/29/2020  Medication Sig  . albuterol (VENTOLIN HFA) 108 (90 Base) MCG/ACT inhaler INHALE 1-2 PUFFS INTO THE LUNGS EVERY 6 (SIX) HOURS AS NEEDED FOR WHEEZING OR SHORTNESS OF BREATH.  . benzonatate (TESSALON) 100 MG capsule TAKE 1 CAPSULE BY MOUTH THREE TIMES A DAY AS NEEDED  . Calcium-Vitamin D-Vitamin K (CALCIUM + D + K PO) Take by mouth daily.  . cetirizine (ZYRTEC) 10 MG tablet Take 10 mg by mouth daily as needed.  . conjugated estrogens (PREMARIN) vaginal cream Place vaginally once a week.  Mariane Baumgarten Calcium (STOOL SOFTENER PO) Take by mouth as needed.  . fluocinonide cream (LIDEX) 9.32 % APPLY ONE APPLICATION TOPICALLY TWO TIMES DAILY AS NEEDED  . fluticasone (FLONASE) 50 MCG/ACT nasal spray Place 2 sprays into both nostrils daily as needed.  . Hyprom-Naphaz-Polysorb-Zn Sulf (CLEAR EYES COMPLETE OP) Apply to eye.  Marland Kitchen MELATONIN ER PO Take by mouth as needed.  . pantoprazole (PROTONIX) 40 MG tablet Take 1 tablet (40 mg total) by mouth daily.  Marland Kitchen triamterene-hydrochlorothiazide  (MAXZIDE) 75-50 MG tablet TAKE ONE HALF TAB EVERY MORNING  . [DISCONTINUED] promethazine-dextromethorphan (PROMETHAZINE-DM) 6.25-15 MG/5ML syrup Take 5 mLs by mouth at bedtime as needed for cough. (Patient not taking: Reported on 09/29/2020)   No facility-administered encounter medications on file as of 09/29/2020.     Review of Systems  Review of Systems  No chest pain with exertion.  No orthopnea or PND.  Comprehensive review systems otherwise negative. Physical Exam  BP 116/68 (BP Location: Right Arm, Cuff Size: Normal)   Pulse 67   Temp (!) 97.2 F (36.2 C) (Temporal)   Ht 5\' 5"  (1.651 m)   Wt 150 lb 3.2 oz (68.1 kg)   SpO2 97%   BMI 24.99 kg/m   Wt Readings from Last 5 Encounters:  09/29/20 150 lb 3.2 oz (68.1 kg)  06/09/20 157 lb (71.2 kg)  09/09/19 147 lb 8 oz (66.9 kg)  11/20/18 146 lb (66.2 kg)  11/19/17 148 lb (67.1 kg)    BMI Readings from Last 5 Encounters:  09/29/20 24.99 kg/m  06/09/20 26.13 kg/m  09/09/19 24.55 kg/m  11/20/18 24.30 kg/m  11/19/17 24.63 kg/m     Physical Exam General: Well-appearing, no acute distress Eyes: EOMI, no icterus Neck: Supple, no JVP appreciated Respiratory: Clear to auscultate bilaterally, no wheeze Cardiovascular: Regular rhythm, no murmurs Abdomen: Nondistended, bowel sounds present MSK: No synovitis, no effusion Neuro: Normal gait, no weakness Psych: Normal mood, full affect   Assessment & Plan:   Chronic cough: Present for many months starting shortly after second Covid vaccine in February 2021.  Chest x-ray 06/2020 without acute finding.  Seems to have resolved with more recent use of dextromethorphan and Tessalon Perles.  Query upper respiratory cough syndrome related to asthma and essentially recent cough reflex that was broken with symptomatic treatment.  High suspicion for element of cough variant asthma given reports of triggers with different chemicals, etc.  Wonder if change in pollens as weather has cooled  has led to improve cough.  Regardless, no need for therapy or further investigation given improvement.  Would favor ICS or ICS/LABA for empiric therapy of cough variant asthma if cough were to return.  Asthma: Seems well controlled.  Using albuterol twice daily as current maintenance.  See no reason to really change this at this time given lack of other symptoms.  Favor ICS/LABA as above if cough returns.   Return if symptoms worsen or fail to improve.   Lanier Clam, MD 09/29/2020

## 2020-10-27 DIAGNOSIS — Z20828 Contact with and (suspected) exposure to other viral communicable diseases: Secondary | ICD-10-CM | POA: Diagnosis not present

## 2020-11-15 ENCOUNTER — Other Ambulatory Visit: Payer: Self-pay

## 2020-11-16 ENCOUNTER — Telehealth: Payer: Self-pay | Admitting: Internal Medicine

## 2020-11-16 ENCOUNTER — Encounter: Payer: Medicare PPO | Admitting: Internal Medicine

## 2020-11-16 MED ORDER — TRIAMTERENE-HCTZ 75-50 MG PO TABS: ORAL_TABLET | ORAL | 4 refills | Status: DC

## 2020-11-16 NOTE — Telephone Encounter (Signed)
Refill of Maxzide sent to [facility]

## 2020-11-16 NOTE — Telephone Encounter (Signed)
Patient states that she is out of her blood pressure medication.  Patient unsure of the name.  Please advise.

## 2020-11-23 ENCOUNTER — Encounter: Payer: Medicare Other | Admitting: Internal Medicine

## 2021-01-03 ENCOUNTER — Encounter: Payer: Medicare PPO | Admitting: Internal Medicine

## 2021-01-16 DIAGNOSIS — Z20822 Contact with and (suspected) exposure to covid-19: Secondary | ICD-10-CM | POA: Diagnosis not present

## 2021-02-09 ENCOUNTER — Other Ambulatory Visit: Payer: Self-pay

## 2021-02-10 ENCOUNTER — Ambulatory Visit (INDEPENDENT_AMBULATORY_CARE_PROVIDER_SITE_OTHER): Payer: Medicare PPO | Admitting: Internal Medicine

## 2021-02-10 ENCOUNTER — Encounter: Payer: Self-pay | Admitting: Internal Medicine

## 2021-02-10 ENCOUNTER — Other Ambulatory Visit (INDEPENDENT_AMBULATORY_CARE_PROVIDER_SITE_OTHER): Payer: Medicare PPO

## 2021-02-10 ENCOUNTER — Other Ambulatory Visit: Payer: Self-pay | Admitting: Internal Medicine

## 2021-02-10 VITALS — BP 120/78 | HR 66 | Temp 98.1°F | Ht 65.0 in | Wt 148.4 lb

## 2021-02-10 DIAGNOSIS — I10 Essential (primary) hypertension: Secondary | ICD-10-CM | POA: Diagnosis not present

## 2021-02-10 DIAGNOSIS — E559 Vitamin D deficiency, unspecified: Secondary | ICD-10-CM

## 2021-02-10 DIAGNOSIS — Z23 Encounter for immunization: Secondary | ICD-10-CM | POA: Diagnosis not present

## 2021-02-10 DIAGNOSIS — Z0000 Encounter for general adult medical examination without abnormal findings: Secondary | ICD-10-CM | POA: Diagnosis not present

## 2021-02-10 DIAGNOSIS — R7302 Impaired glucose tolerance (oral): Secondary | ICD-10-CM | POA: Insufficient documentation

## 2021-02-10 DIAGNOSIS — J4520 Mild intermittent asthma, uncomplicated: Secondary | ICD-10-CM

## 2021-02-10 DIAGNOSIS — R7989 Other specified abnormal findings of blood chemistry: Secondary | ICD-10-CM | POA: Diagnosis not present

## 2021-02-10 DIAGNOSIS — Z780 Asymptomatic menopausal state: Secondary | ICD-10-CM | POA: Diagnosis not present

## 2021-02-10 DIAGNOSIS — Z13820 Encounter for screening for osteoporosis: Secondary | ICD-10-CM | POA: Diagnosis not present

## 2021-02-10 DIAGNOSIS — E785 Hyperlipidemia, unspecified: Secondary | ICD-10-CM | POA: Insufficient documentation

## 2021-02-10 DIAGNOSIS — Z1231 Encounter for screening mammogram for malignant neoplasm of breast: Secondary | ICD-10-CM | POA: Diagnosis not present

## 2021-02-10 LAB — CBC WITH DIFFERENTIAL/PLATELET
Basophils Absolute: 0 10*3/uL (ref 0.0–0.1)
Basophils Relative: 0.8 % (ref 0.0–3.0)
Eosinophils Absolute: 0.3 10*3/uL (ref 0.0–0.7)
Eosinophils Relative: 6.2 % — ABNORMAL HIGH (ref 0.0–5.0)
HCT: 38.8 % (ref 36.0–46.0)
Hemoglobin: 12.8 g/dL (ref 12.0–15.0)
Lymphocytes Relative: 31.8 % (ref 12.0–46.0)
Lymphs Abs: 1.4 10*3/uL (ref 0.7–4.0)
MCHC: 33.1 g/dL (ref 30.0–36.0)
MCV: 90.3 fl (ref 78.0–100.0)
Monocytes Absolute: 0.6 10*3/uL (ref 0.1–1.0)
Monocytes Relative: 13.4 % — ABNORMAL HIGH (ref 3.0–12.0)
Neutro Abs: 2.1 10*3/uL (ref 1.4–7.7)
Neutrophils Relative %: 47.8 % (ref 43.0–77.0)
Platelets: 232 10*3/uL (ref 150.0–400.0)
RBC: 4.3 Mil/uL (ref 3.87–5.11)
RDW: 13.8 % (ref 11.5–15.5)
WBC: 4.3 10*3/uL (ref 4.0–10.5)

## 2021-02-10 LAB — COMPREHENSIVE METABOLIC PANEL
ALT: 22 U/L (ref 0–35)
AST: 20 U/L (ref 0–37)
Albumin: 4 g/dL (ref 3.5–5.2)
Alkaline Phosphatase: 67 U/L (ref 39–117)
BUN: 13 mg/dL (ref 6–23)
CO2: 32 mEq/L (ref 19–32)
Calcium: 9.5 mg/dL (ref 8.4–10.5)
Chloride: 102 mEq/L (ref 96–112)
Creatinine, Ser: 0.77 mg/dL (ref 0.40–1.20)
GFR: 74.69 mL/min (ref >60.00)
Glucose, Bld: 87 mg/dL (ref 70–99)
Potassium: 4 mEq/L (ref 3.5–5.1)
Sodium: 141 mEq/L (ref 135–145)
Total Bilirubin: 0.6 mg/dL (ref 0.2–1.2)
Total Protein: 6.7 g/dL (ref 6.0–8.3)

## 2021-02-10 LAB — LIPID PANEL
Cholesterol: 204 mg/dL — ABNORMAL HIGH (ref 0–200)
HDL: 65.4 mg/dL (ref >39.00)
LDL Cholesterol: 124 mg/dL — ABNORMAL HIGH (ref 0–99)
NonHDL: 138.14
Total CHOL/HDL Ratio: 3
Triglycerides: 69 mg/dL (ref 0.0–149.0)
VLDL: 13.8 mg/dL (ref 0.0–40.0)

## 2021-02-10 LAB — VITAMIN D 25 HYDROXY (VIT D DEFICIENCY, FRACTURES): VITD: 21.93 ng/mL — ABNORMAL LOW (ref 30.00–100.00)

## 2021-02-10 LAB — T3, FREE: T3, Free: 3.2 pg/mL (ref 2.3–4.2)

## 2021-02-10 LAB — VITAMIN B12: Vitamin B-12: 1136 pg/mL — ABNORMAL HIGH (ref 211–911)

## 2021-02-10 LAB — T4, FREE: Free T4: 0.68 ng/dL (ref 0.60–1.60)

## 2021-02-10 LAB — HEMOGLOBIN A1C: Hgb A1c MFr Bld: 6.2 % (ref 4.6–6.5)

## 2021-02-10 LAB — TSH: TSH: 5.14 u[IU]/mL — ABNORMAL HIGH (ref 0.35–4.50)

## 2021-02-10 MED ORDER — TRIAMTERENE-HCTZ 75-50 MG PO TABS: ORAL_TABLET | ORAL | 4 refills | Status: DC

## 2021-02-10 MED ORDER — FLUOCINONIDE 0.05 % EX CREA: TOPICAL_CREAM | CUTANEOUS | 3 refills | Status: AC

## 2021-02-10 MED ORDER — CETIRIZINE HCL 10 MG PO TABS: 10.0000 mg | ORAL_TABLET | Freq: Every day | ORAL | 1 refills | Status: DC | PRN

## 2021-02-10 MED ORDER — VITAMIN D (ERGOCALCIFEROL) 1.25 MG (50000 UNIT) PO CAPS: 50000.0000 [IU] | ORAL_CAPSULE | ORAL | 0 refills | Status: AC

## 2021-02-10 NOTE — Addendum Note (Signed)
Addended by: Tessie Fass D on: 02/10/2021 10:43 AM   Modules accepted: Orders

## 2021-02-10 NOTE — Patient Instructions (Addendum)
-Nice seeing you today!!  -Lab work today; will notify you once results are available.  -Pneumonia vaccine today.  -Remember your fourth COVID vaccine at the pharmacy.  -Mammogram and bone density requested today.  -Schedule follow up in 1 year or sooner as needed.   Preventive Care 68 Years and Older, Female Preventive care refers to lifestyle choices and visits with your health care provider that can promote health and wellness. This includes:  A yearly physical exam. This is also called an annual wellness visit.  Regular dental and eye exams.  Immunizations.  Screening for certain conditions.  Healthy lifestyle choices, such as: ? Eating a healthy diet. ? Getting regular exercise. ? Not using drugs or products that contain nicotine and tobacco. ? Limiting alcohol use. What can I expect for my preventive care visit? Physical exam Your health care provider will check your:  Height and weight. These may be used to calculate your BMI (body mass index). BMI is a measurement that tells if you are at a healthy weight.  Heart rate and blood pressure.  Body temperature.  Skin for abnormal spots. Counseling Your health care provider may ask you questions about your:  Past medical problems.  Family's medical history.  Alcohol, tobacco, and drug use.  Emotional well-being.  Home life and relationship well-being.  Sexual activity.  Diet, exercise, and sleep habits.  History of falls.  Memory and ability to understand (cognition).  Work and work Statistician.  Pregnancy and menstrual history.  Access to firearms. What immunizations do I need? Vaccines are usually given at various ages, according to a schedule. Your health care provider will recommend vaccines for you based on your age, medical history, and lifestyle or other factors, such as travel or where you work.   What tests do I need? Blood tests  Lipid and cholesterol levels. These may be checked  every 5 years, or more often depending on your overall health.  Hepatitis C test.  Hepatitis B test. Screening  Lung cancer screening. You may have this screening every year starting at age 77 if you have a 30-pack-year history of smoking and currently smoke or have quit within the past 15 years.  Colorectal cancer screening. ? All adults should have this screening starting at age 77 and continuing until age 49. ? Your health care provider may recommend screening at age 52 if you are at increased risk. ? You will have tests every 1-10 years, depending on your results and the type of screening test.  Diabetes screening. ? This is done by checking your blood sugar (glucose) after you have not eaten for a while (fasting). ? You may have this done every 1-3 years.  Mammogram. ? This may be done every 1-2 years. ? Talk with your health care provider about how often you should have regular mammograms.  Abdominal aortic aneurysm (AAA) screening. You may need this if you are a current or former smoker.  BRCA-related cancer screening. This may be done if you have a family history of breast, ovarian, tubal, or peritoneal cancers. Other tests  STD (sexually transmitted disease) testing, if you are at risk.  Bone density scan. This is done to screen for osteoporosis. You may have this done starting at age 77. Talk with your health care provider about your test results, treatment options, and if necessary, the need for more tests. Follow these instructions at home: Eating and drinking  Eat a diet that includes fresh fruits and vegetables, whole grains, lean  protein, and low-fat dairy products. Limit your intake of foods with high amounts of sugar, saturated fats, and salt.  Take vitamin and mineral supplements as recommended by your health care provider.  Do not drink alcohol if your health care provider tells you not to drink.  If you drink alcohol: ? Limit how much you have to 0-1 drink  a day. ? Be aware of how much alcohol is in your drink. In the [place] one drink equals one 12 oz bottle of beer (355 mL), one 5 oz glass of wine (148 mL), or one 1 oz glass of hard liquor (44 mL).   Lifestyle  Take daily care of your teeth and gums. Brush your teeth every morning and night with fluoride toothpaste. Floss one time each day.  Stay active. Exercise for at least 30 minutes 5 or more days each week.  Do not use any products that contain nicotine or tobacco, such as cigarettes, e-cigarettes, and chewing tobacco. If you need help quitting, ask your health care provider.  Do not use drugs.  If you are sexually active, practice safe sex. Use a condom or other form of protection in order to prevent STIs (sexually transmitted infections).  Talk with your health care provider about taking a low-dose aspirin or statin.  Find healthy ways to cope with stress, such as: ? Meditation, yoga, or listening to music. ? Journaling. ? Talking to a trusted person. ? Spending time with friends and family. Safety  Always wear your seat belt while driving or riding in a vehicle.  Do not drive: ? If you have been drinking alcohol. Do not ride with someone who has been drinking. ? When you are tired or distracted. ? While texting.  Wear a helmet and other protective equipment during sports activities.  If you have firearms in your house, make sure you follow all gun safety procedures. What's next?  Visit your health care provider once a year for an annual wellness visit.  Ask your health care provider how often you should have your eyes and teeth checked.  Stay up to date on all vaccines. This information is not intended to replace advice given to you by your health care provider. Make sure you discuss any questions you have with your health care provider. Document Revised: 09/28/2020 Document Reviewed: 10/02/2018 Elsevier Patient Education  2021 [employer].

## 2021-02-10 NOTE — Addendum Note (Signed)
Addended by: Westley Hummer B on: 02/10/2021 10:34 AM   Modules accepted: Orders

## 2021-02-10 NOTE — Addendum Note (Signed)
Addended by: Westley Hummer B on: 02/10/2021 02:49 PM   Modules accepted: Orders

## 2021-02-10 NOTE — Progress Notes (Signed)
Established Patient Office Visit     This visit occurred during the SARS-CoV-2 public health emergency.  Safety protocols were in place, including screening questions prior to the visit, additional usage of staff PPE, and extensive cleaning of exam room while observing appropriate contact time as indicated for disinfecting solutions.    CC/Reason for Visit: Annual preventive exam and subsequent Medicare wellness visit  HPI: Wendy Salazar is a 75 y.o. female who is coming in today for the above mentioned reasons. Past Medical History is significant for: Hypertension that has been well controlled as well as asthma and seasonal allergies.  This past year she had cataract surgery but is otherwise been doing well.  She has routine eye and dental care, no perceived hearing issues, she remains fairly active with yard work.  She declines further Pap smears.  She had a colonoscopy in 2015, she is overdue for mammogram and a bone density.  She is now eligible for a pneumonia and COVID booster.  She has no acute complaints.   Past Medical/Surgical History: Past Medical History:  Diagnosis Date  . Allergic eye reaction   . Allergic rhinitis   . Asthma   . Hair loss   . HTN (hypertension)   . PMS (premenstrual syndrome)   . Rectal tenesmus   . Urinary incontinence 2005    Past Surgical History:  Procedure Laterality Date  . ABDOMINAL SURGERY  2006   Tummy Tuck  . AUGMENTATION MAMMAPLASTY Bilateral 2007  . BREAST ENHANCEMENT SURGERY  2006   Saline implants  . CATARACT EXTRACTION Left 10/28/2013  . LIPOSUCTION  2006  . OTHER SURGICAL HISTORY     CB X3  . TUBAL LIGATION  1985    Social History:  reports that she has never smoked. She has never used smokeless tobacco. She reports current alcohol use of about 1.0 standard drink of alcohol per week. She reports that she does not use drugs.  Allergies: Allergies  Allergen Reactions  . Penicillins     REACTION: hives    Family  History:  Family History  Problem Relation Age of Onset  . Alcohol abuse Other   . Diabetes Other   . Hypertension Other   . Prostate cancer Other   . Colon cancer Neg Hx      Current Outpatient Medications:  .  albuterol (VENTOLIN HFA) 108 (90 Base) MCG/ACT inhaler, INHALE 1-2 PUFFS INTO THE LUNGS EVERY 6 (SIX) HOURS AS NEEDED FOR WHEEZING OR SHORTNESS OF BREATH., Disp: 18 each, Rfl: 2 .  Calcium-Vitamin D-Vitamin K (CALCIUM + D + K PO), Take by mouth daily., Disp: , Rfl:  .  conjugated estrogens (PREMARIN) vaginal cream, Place vaginally once a week., Disp: 42.5 g, Rfl: 11 .  Docusate Calcium (STOOL SOFTENER PO), Take by mouth as needed., Disp: , Rfl:  .  fluticasone (FLONASE) 50 MCG/ACT nasal spray, Place 2 sprays into both nostrils daily as needed., Disp: 32 g, Rfl: 11 .  Hyprom-Naphaz-Polysorb-Zn Sulf (CLEAR EYES COMPLETE OP), Apply to eye., Disp: , Rfl:  .  MELATONIN ER PO, Take by mouth as needed., Disp: , Rfl:  .  cetirizine (ZYRTEC) 10 MG tablet, Take 1 tablet (10 mg total) by mouth daily as needed., Disp: 90 tablet, Rfl: 1 .  fluocinonide cream (LIDEX) 0.05 %, APPLY ONE APPLICATION TOPICALLY TWO TIMES DAILY AS NEEDED, Disp: 60 g, Rfl: 3 .  triamterene-hydrochlorothiazide (MAXZIDE) 75-50 MG tablet, TAKE ONE HALF TAB EVERY MORNING, Disp: 45 tablet, Rfl:  4  Review of Systems:  Constitutional: Denies fever, chills, diaphoresis, appetite change and fatigue.  HEENT: Denies photophobia, eye pain, redness, hearing loss, ear pain, congestion, sore throat, rhinorrhea, sneezing, mouth sores, trouble swallowing, neck pain, neck stiffness and tinnitus.   Respiratory: Denies SOB, DOE, cough, chest tightness,  and wheezing.   Cardiovascular: Denies chest pain, palpitations and leg swelling.  Gastrointestinal: Denies nausea, vomiting, abdominal pain, diarrhea, constipation, blood in stool and abdominal distention.  Genitourinary: Denies dysuria, urgency, frequency, hematuria, flank pain and  difficulty urinating.  Endocrine: Denies: hot or cold intolerance, sweats, changes in hair or nails, polyuria, polydipsia. Musculoskeletal: Denies myalgias, back pain, joint swelling, arthralgias and gait problem.  Skin: Denies pallor, rash and wound.  Neurological: Denies dizziness, seizures, syncope, weakness, light-headedness, numbness and headaches.  Hematological: Denies adenopathy. Easy bruising, personal or family bleeding history  Psychiatric/Behavioral: Denies suicidal ideation, mood changes, confusion, nervousness, sleep disturbance and agitation    Physical Exam: Vitals:   02/10/21 1003  BP: 120/78  Pulse: 66  Temp: 98.1 F (36.7 C)  TempSrc: Oral  SpO2: 94%  Weight: 148 lb 6.4 oz (67.3 kg)  Height: 5' 5"  (1.651 m)    Body mass index is 24.7 kg/m.   Constitutional: NAD, calm, comfortable Eyes: PERRL, lids and conjunctivae normal ENMT: Mucous membranes are moist. Posterior pharynx clear of any exudate or lesions. Normal dentition. Tympanic membrane is pearly white, no erythema or bulging. Neck: normal, supple, no masses, no thyromegaly Respiratory: clear to auscultation bilaterally, no wheezing, no crackles. Normal respiratory effort. No accessory muscle use.  Cardiovascular: Regular rate and rhythm, no murmurs / rubs / gallops. No extremity edema. 2+ pedal pulses. No carotid bruits.  Abdomen: no tenderness, no masses palpated. No hepatosplenomegaly. Bowel sounds positive.  Musculoskeletal: no clubbing / cyanosis. No joint deformity upper and lower extremities. Good ROM, no contractures. Normal muscle tone.  Skin: no rashes, lesions, ulcers. No induration Neurologic: CN 2-12 grossly intact. Sensation intact, DTR normal. Strength 5/5 in all 4.  Psychiatric: Normal judgment and insight. Alert and oriented x 3. Normal mood.    Subsequent Medicare wellness visit   1. Risk factors, based on past  M,S,F -cardiovascular disease risk factors include age and history of  hypertension   2.  Physical activities: Yardwork   3.  Depression/mood:  Stable not depressed   4.  Hearing:  No perceived issues   5.  ADL's: Independent in all ADLs   6.  Fall risk:  Low fall risk   7.  Home safety: No problems identified   8.  Height weight, and visual acuity: height and weight as above, vision:   Visual Acuity Screening   Right eye Left eye Both eyes  Without correction: 20/50 20/32 20/32   With correction:        9.  Counseling:  Advised to continue healthy lifestyle, incorporate 30 minutes of activity at least 3 days a week   10. Lab orders based on risk factors: Laboratory update will be reviewed   11. Referral :  For her mammogram and bone density test   12. Care plan:  Follow-up with me yearly   13. Cognitive assessment:  No cognitive impairment   14. Screening: Patient provided with a written and personalized 5-10 year screening schedule in the AVS.   yes   15. Provider List Update:   PCP only  16. Advance Directives: Full code   17. Opioids: Patient is not on any opioid prescriptions and has no risk  factors for a substance use disorder.   [facility] Office Visit from 02/10/2021 in [place] at [facility]  PHQ-9 Total Score 0      Fall Risk  02/10/2021 11/27/2016 11/14/2015 11/03/2014 09/28/2013  Falls in the past year? 0 No No No Yes  Number falls in past yr: 0 - - - 1  Injury with Fall? 0 - - - No     Impression and Plan:  Encounter for preventive health examination  -She has routine eye and dental care. -She will obtain a PPSV23 in office today, a fourth COVID-vaccine at pharmacy but otherwise immunizations are up-to-date. -Screening labs today. -Healthy lifestyle discussed in detail. -She elects to no longer pursue Pap smears due to age. -Overdue for mammogram, will order. -She had a colonoscopy in 2015 and is a 10-year callback. -DEXA scan ordered today for osteoporosis screening.  Encounter for osteoporosis  screening in asymptomatic postmenopausal patient  - Plan: DG Bone Density  Essential hypertension  - Plan: triamterene-hydrochlorothiazide (MAXZIDE) 75-50 MG tablet, CBC with Differential/Platelet, Comprehensive metabolic panel, TSH -Excellent control.  Mild intermittent asthma without complication  - Plan: cetirizine (ZYRTEC) 10 MG tablet -Has been stable, no recent flareups, she is only on as needed albuterol.  Need for vaccination against Streptococcus pneumoniae -PPSV23 administered today.   Patient Instructions   -Nice seeing you today!!  -Lab work today; will notify you once results are available.  -Pneumonia vaccine today.  -Remember your fourth COVID vaccine at the pharmacy.  -Mammogram and bone density requested today.  -Schedule follow up in 1 year or sooner as needed.   Preventive Care 75 Years and Older, Female Preventive care refers to lifestyle choices and visits with your health care provider that can promote health and wellness. This includes:  A yearly physical exam. This is also called an annual wellness visit.  Regular dental and eye exams.  Immunizations.  Screening for certain conditions.  Healthy lifestyle choices, such as: ? Eating a healthy diet. ? Getting regular exercise. ? Not using drugs or products that contain nicotine and tobacco. ? Limiting alcohol use. What can I expect for my preventive care visit? Physical exam Your health care provider will check your:  Height and weight. These may be used to calculate your BMI (body mass index). BMI is a measurement that tells if you are at a healthy weight.  Heart rate and blood pressure.  Body temperature.  Skin for abnormal spots. Counseling Your health care provider may ask you questions about your:  Past medical problems.  Family's medical history.  Alcohol, tobacco, and drug use.  Emotional well-being.  Home life and relationship well-being.  Sexual activity.  Diet,  exercise, and sleep habits.  History of falls.  Memory and ability to understand (cognition).  Work and work Statistician.  Pregnancy and menstrual history.  Access to firearms. What immunizations do I need? Vaccines are usually given at various ages, according to a schedule. Your health care provider will recommend vaccines for you based on your age, medical history, and lifestyle or other factors, such as travel or where you work.   What tests do I need? Blood tests  Lipid and cholesterol levels. These may be checked every 5 years, or more often depending on your overall health.  Hepatitis C test.  Hepatitis B test. Screening  Lung cancer screening. You may have this screening every year starting at age 41 if you have a 30-pack-year history of smoking and currently smoke or have quit within  the past 15 years.  Colorectal cancer screening. ? All adults should have this screening starting at age 66 and continuing until age 3. ? Your health care provider may recommend screening at age 84 if you are at increased risk. ? You will have tests every 1-10 years, depending on your results and the type of screening test.  Diabetes screening. ? This is done by checking your blood sugar (glucose) after you have not eaten for a while (fasting). ? You may have this done every 1-3 years.  Mammogram. ? This may be done every 1-2 years. ? Talk with your health care provider about how often you should have regular mammograms.  Abdominal aortic aneurysm (AAA) screening. You may need this if you are a current or former smoker.  BRCA-related cancer screening. This may be done if you have a family history of breast, ovarian, tubal, or peritoneal cancers. Other tests  STD (sexually transmitted disease) testing, if you are at risk.  Bone density scan. This is done to screen for osteoporosis. You may have this done starting at age 16. Talk with your health care provider about your test results,  treatment options, and if necessary, the need for more tests. Follow these instructions at home: Eating and drinking  Eat a diet that includes fresh fruits and vegetables, whole grains, lean protein, and low-fat dairy products. Limit your intake of foods with high amounts of sugar, saturated fats, and salt.  Take vitamin and mineral supplements as recommended by your health care provider.  Do not drink alcohol if your health care provider tells you not to drink.  If you drink alcohol: ? Limit how much you have to 0-1 drink a day. ? Be aware of how much alcohol is in your drink. In the [place] one drink equals one 12 oz bottle of beer (355 mL), one 5 oz glass of wine (148 mL), or one 1 oz glass of hard liquor (44 mL).   Lifestyle  Take daily care of your teeth and gums. Brush your teeth every morning and night with fluoride toothpaste. Floss one time each day.  Stay active. Exercise for at least 30 minutes 5 or more days each week.  Do not use any products that contain nicotine or tobacco, such as cigarettes, e-cigarettes, and chewing tobacco. If you need help quitting, ask your health care provider.  Do not use drugs.  If you are sexually active, practice safe sex. Use a condom or other form of protection in order to prevent STIs (sexually transmitted infections).  Talk with your health care provider about taking a low-dose aspirin or statin.  Find healthy ways to cope with stress, such as: ? Meditation, yoga, or listening to music. ? Journaling. ? Talking to a trusted person. ? Spending time with friends and family. Safety  Always wear your seat belt while driving or riding in a vehicle.  Do not drive: ? If you have been drinking alcohol. Do not ride with someone who has been drinking. ? When you are tired or distracted. ? While texting.  Wear a helmet and other protective equipment during sports activities.  If you have firearms in your house, make sure you follow all gun  safety procedures. What's next?  Visit your health care provider once a year for an annual wellness visit.  Ask your health care provider how often you should have your eyes and teeth checked.  Stay up to date on all vaccines. This information is not intended to replace  advice given to you by your health care provider. Make sure you discuss any questions you have with your health care provider. Document Revised: 09/28/2020 Document Reviewed: 10/02/2018 Elsevier Patient Education  2021 [place], MD [facility]

## 2021-02-13 ENCOUNTER — Ambulatory Visit (INDEPENDENT_AMBULATORY_CARE_PROVIDER_SITE_OTHER)
Admission: RE | Admit: 2021-02-13 | Discharge: 2021-02-13 | Disposition: A | Discharge status: Home / Self Care | Payer: Medicare PPO | Source: Ambulatory Visit | Attending: Internal Medicine | Admitting: Internal Medicine

## 2021-02-13 ENCOUNTER — Other Ambulatory Visit: Payer: Self-pay

## 2021-02-13 DIAGNOSIS — Z780 Asymptomatic menopausal state: Secondary | ICD-10-CM | POA: Diagnosis not present

## 2021-02-13 DIAGNOSIS — Z13820 Encounter for screening for osteoporosis: Secondary | ICD-10-CM

## 2021-02-23 ENCOUNTER — Other Ambulatory Visit: Payer: Self-pay | Admitting: Internal Medicine

## 2021-02-23 DIAGNOSIS — E559 Vitamin D deficiency, unspecified: Secondary | ICD-10-CM

## 2021-02-23 DIAGNOSIS — R7989 Other specified abnormal findings of blood chemistry: Secondary | ICD-10-CM

## 2021-02-24 ENCOUNTER — Telehealth: Payer: Self-pay | Admitting: Internal Medicine

## 2021-02-24 NOTE — Telephone Encounter (Signed)
Pt is calling in wanting to have her last lab results sent to her MyChart.

## 2021-02-24 NOTE — Telephone Encounter (Signed)
MyChart is not activated.  Text sent.

## 2021-04-03 ENCOUNTER — Ambulatory Visit
Admission: RE | Admit: 2021-04-03 | Discharge: 2021-04-03 | Disposition: A | Discharge status: Home / Self Care | Payer: Medicare PPO | Source: Ambulatory Visit | Attending: Internal Medicine | Admitting: Internal Medicine

## 2021-04-03 ENCOUNTER — Other Ambulatory Visit: Payer: Self-pay

## 2021-04-03 DIAGNOSIS — Z1231 Encounter for screening mammogram for malignant neoplasm of breast: Secondary | ICD-10-CM

## 2021-05-10 ENCOUNTER — Other Ambulatory Visit: Payer: Self-pay | Admitting: Internal Medicine

## 2021-05-10 DIAGNOSIS — E559 Vitamin D deficiency, unspecified: Secondary | ICD-10-CM

## 2021-05-18 DIAGNOSIS — H40003 Preglaucoma, unspecified, bilateral: Secondary | ICD-10-CM | POA: Diagnosis not present

## 2021-05-25 ENCOUNTER — Other Ambulatory Visit: Payer: Self-pay

## 2021-05-25 ENCOUNTER — Other Ambulatory Visit (INDEPENDENT_AMBULATORY_CARE_PROVIDER_SITE_OTHER): Payer: Medicare PPO

## 2021-05-25 DIAGNOSIS — E559 Vitamin D deficiency, unspecified: Secondary | ICD-10-CM | POA: Diagnosis not present

## 2021-05-25 DIAGNOSIS — R7989 Other specified abnormal findings of blood chemistry: Secondary | ICD-10-CM | POA: Diagnosis not present

## 2021-05-25 LAB — T3, FREE: T3, Free: 3.6 pg/mL (ref 2.3–4.2)

## 2021-05-25 LAB — T4, FREE: Free T4: 0.74 ng/dL (ref 0.60–1.60)

## 2021-05-25 LAB — TSH: TSH: 4.17 u[IU]/mL (ref 0.35–5.50)

## 2021-05-25 LAB — VITAMIN D 25 HYDROXY (VIT D DEFICIENCY, FRACTURES): VITD: 40.33 ng/mL (ref 30.00–100.00)

## 2021-05-26 ENCOUNTER — Other Ambulatory Visit: Payer: Self-pay

## 2021-06-07 ENCOUNTER — Ambulatory Visit: Payer: Medicare PPO | Admitting: Internal Medicine

## 2021-06-13 ENCOUNTER — Ambulatory Visit: Payer: Medicare PPO | Admitting: Internal Medicine

## 2021-06-13 ENCOUNTER — Other Ambulatory Visit: Payer: Self-pay

## 2021-06-13 ENCOUNTER — Encounter: Payer: Self-pay | Admitting: Internal Medicine

## 2021-06-13 VITALS — BP 104/68 | Temp 98.5°F | Wt 145.4 lb

## 2021-06-13 DIAGNOSIS — E559 Vitamin D deficiency, unspecified: Secondary | ICD-10-CM

## 2021-06-13 DIAGNOSIS — R7989 Other specified abnormal findings of blood chemistry: Secondary | ICD-10-CM | POA: Diagnosis not present

## 2021-06-13 DIAGNOSIS — I10 Essential (primary) hypertension: Secondary | ICD-10-CM

## 2021-06-13 DIAGNOSIS — R7302 Impaired glucose tolerance (oral): Secondary | ICD-10-CM | POA: Diagnosis not present

## 2021-06-13 DIAGNOSIS — E782 Mixed hyperlipidemia: Secondary | ICD-10-CM | POA: Diagnosis not present

## 2021-06-13 LAB — POCT GLYCOSYLATED HEMOGLOBIN (HGB A1C): Hemoglobin A1C: 5.9 % — AB (ref 4.0–5.6)

## 2021-06-13 NOTE — Progress Notes (Signed)
Established Patient Office Visit     This visit occurred during the SARS-CoV-2 public health emergency.  Safety protocols were in place, including screening questions prior to the visit, additional usage of staff PPE, and extensive cleaning of exam room while observing appropriate contact time as indicated for disinfecting solutions.    CC/Reason for Visit: Follow-up chronic medical conditions  HPI: Wendy Salazar is a 75 y.o. female who is coming in today for the above mentioned reasons. Past Medical History is significant for: Hypertension, asthma, seasonal allergies, vitamin D deficiency, hyperlipidemia not on medications and impaired glucose tolerance.  She has been doing well and has no acute concerns today.   Past Medical/Surgical History: Past Medical History:  Diagnosis Date   Allergic eye reaction    Allergic rhinitis    Asthma    Hair loss    HTN (hypertension)    PMS (premenstrual syndrome)    Rectal tenesmus    Urinary incontinence 2005    Past Surgical History:  Procedure Laterality Date   ABDOMINAL SURGERY  2006   Tummy Tuck   AUGMENTATION MAMMAPLASTY Bilateral 2007   BREAST ENHANCEMENT SURGERY  2006   Saline implants   CATARACT EXTRACTION Left 10/28/2013   LIPOSUCTION  2006   OTHER SURGICAL HISTORY     CB X3   TUBAL LIGATION  1985    Social History:  reports that she has never smoked. She has never used smokeless tobacco. She reports current alcohol use of about 1.0 standard drink per week. She reports that she does not use drugs.  Allergies: Allergies  Allergen Reactions   Penicillins     REACTION: hives    Family History:  Family History  Problem Relation Age of Onset   Alcohol abuse Other    Diabetes Other    Hypertension Other    Prostate cancer Other    Colon cancer Neg Hx      Current Outpatient Medications:    albuterol (VENTOLIN HFA) 108 (90 Base) MCG/ACT inhaler, INHALE 1-2 PUFFS INTO THE LUNGS EVERY 6 (SIX) HOURS AS  NEEDED FOR WHEEZING OR SHORTNESS OF BREATH., Disp: 18 each, Rfl: 2   Calcium-Vitamin D-Vitamin K (CALCIUM + D + K PO), Take by mouth daily., Disp: , Rfl:    cetirizine (ZYRTEC) 10 MG tablet, Take 1 tablet (10 mg total) by mouth daily as needed., Disp: 90 tablet, Rfl: 1   conjugated estrogens (PREMARIN) vaginal cream, Place vaginally once a week., Disp: 42.5 g, Rfl: 11   Docusate Calcium (STOOL SOFTENER PO), Take by mouth as needed., Disp: , Rfl:    fluocinonide cream (LIDEX) 0.05 %, APPLY ONE APPLICATION TOPICALLY TWO TIMES DAILY AS NEEDED, Disp: 60 g, Rfl: 3   fluticasone (FLONASE) 50 MCG/ACT nasal spray, Place 2 sprays into both nostrils daily as needed., Disp: 32 g, Rfl: 11   Hyprom-Naphaz-Polysorb-Zn Sulf (CLEAR EYES COMPLETE OP), Apply to eye., Disp: , Rfl:    MELATONIN ER PO, Take by mouth as needed., Disp: , Rfl:    triamterene-hydrochlorothiazide (MAXZIDE) 75-50 MG tablet, TAKE ONE HALF TAB EVERY MORNING, Disp: 45 tablet, Rfl: 4  Review of Systems:  Constitutional: Denies fever, chills, diaphoresis, appetite change and fatigue.  HEENT: Denies photophobia, eye pain, redness, hearing loss, ear pain, congestion, sore throat, rhinorrhea, sneezing, mouth sores, trouble swallowing, neck pain, neck stiffness and tinnitus.   Respiratory: Denies SOB, DOE, cough, chest tightness,  and wheezing.   Cardiovascular: Denies chest pain, palpitations and leg swelling.  Gastrointestinal: Denies nausea, vomiting, abdominal pain, diarrhea, constipation, blood in stool and abdominal distention.  Genitourinary: Denies dysuria, urgency, frequency, hematuria, flank pain and difficulty urinating.  Endocrine: Denies: hot or cold intolerance, sweats, changes in hair or nails, polyuria, polydipsia. Musculoskeletal: Denies myalgias, back pain, joint swelling, arthralgias and gait problem.  Skin: Denies pallor, rash and wound.  Neurological: Denies dizziness, seizures, syncope, weakness, light-headedness, numbness  and headaches.  Hematological: Denies adenopathy. Easy bruising, personal or family bleeding history  Psychiatric/Behavioral: Denies suicidal ideation, mood changes, confusion, nervousness, sleep disturbance and agitation    Physical Exam: Vitals:   06/13/21 1532  BP: 104/68  Temp: 98.5 F (36.9 C)  TempSrc: Oral  Weight: 145 lb 6.4 oz (66 kg)    Body mass index is 24.2 kg/m.   Constitutional: NAD, calm, comfortable Eyes: PERRL, lids and conjunctivae normal ENMT: Mucous membranes are moist.  Respiratory: clear to auscultation bilaterally, no wheezing, no crackles. Normal respiratory effort. No accessory muscle use.  Cardiovascular: Regular rate and rhythm, no murmurs / rubs / gallops. No extremity edema.  Neurologic: Grossly intact and nonfocal Psychiatric: Normal judgment and insight. Alert and oriented x 3. Normal mood.    Impression and Plan:  IGT (impaired glucose tolerance)  -In office A1c today is 5.9.  Essential hypertension -Well-controlled  Mixed hyperlipidemia -Last lipid panel in April 2022 with a total cholesterol of 204, triglycerides 69 and LDL 124. -She is not on medication, she is managing with lifestyle changes.  Vitamin D deficiency -Levels have now normalized and she is on over-the-counter supplementation.  Abnormal TSH -Thyroid function test have normalized.  Time spent: 31 minutes reviewing chart, interviewing and examining patient and formulating plan of care.     Lelon Frohlich, MD [facility]

## 2021-08-16 ENCOUNTER — Other Ambulatory Visit: Payer: Self-pay | Admitting: Internal Medicine

## 2021-08-16 DIAGNOSIS — J4520 Mild intermittent asthma, uncomplicated: Secondary | ICD-10-CM

## 2021-08-29 ENCOUNTER — Ambulatory Visit: Payer: Self-pay | Admitting: Internal Medicine

## 2021-09-26 ENCOUNTER — Ambulatory Visit: Payer: Medicare PPO | Admitting: Internal Medicine

## 2021-09-26 ENCOUNTER — Encounter: Payer: Self-pay | Admitting: Internal Medicine

## 2021-09-26 VITALS — BP 120/70 | HR 70 | Temp 98.4°F | Wt 149.9 lb

## 2021-09-26 DIAGNOSIS — E559 Vitamin D deficiency, unspecified: Secondary | ICD-10-CM | POA: Diagnosis not present

## 2021-09-26 DIAGNOSIS — R7302 Impaired glucose tolerance (oral): Secondary | ICD-10-CM | POA: Diagnosis not present

## 2021-09-26 DIAGNOSIS — I10 Essential (primary) hypertension: Secondary | ICD-10-CM

## 2021-09-26 DIAGNOSIS — E782 Mixed hyperlipidemia: Secondary | ICD-10-CM

## 2021-09-26 LAB — POCT GLYCOSYLATED HEMOGLOBIN (HGB A1C): Hemoglobin A1C: 6.3 % — AB (ref 4.0–5.6)

## 2021-09-26 NOTE — Progress Notes (Signed)
Established Patient Office Visit     This visit occurred during the SARS-CoV-2 public health emergency.  Safety protocols were in place, including screening questions prior to the visit, additional usage of staff PPE, and extensive cleaning of exam room while observing appropriate contact time as indicated for disinfecting solutions.    CC/Reason for Visit: 35-month follow-up chronic medical conditions  HPI: Wendy Salazar is a 75 y.o. female who is coming in today for the above mentioned reasons. Past Medical History is significant for: Hypertension, asthma, seasonal allergies, vitamin D deficiency, hyperlipidemia not on medications and impaired glucose tolerance.  She has been doing well and has no acute concerns today.  She has had her flu vaccine and her COVID booster.   Past Medical/Surgical History: Past Medical History:  Diagnosis Date   Allergic eye reaction    Allergic rhinitis    Asthma    Hair loss    HTN (hypertension)    PMS (premenstrual syndrome)    Rectal tenesmus    Urinary incontinence 2005    Past Surgical History:  Procedure Laterality Date   ABDOMINAL SURGERY  2006   Tummy Tuck   AUGMENTATION MAMMAPLASTY Bilateral 2007   BREAST ENHANCEMENT SURGERY  2006   Saline implants   CATARACT EXTRACTION Left 10/28/2013   LIPOSUCTION  2006   OTHER SURGICAL HISTORY     CB X3   TUBAL LIGATION  1985    Social History:  reports that she has never smoked. She has never used smokeless tobacco. She reports current alcohol use of about 1.0 standard drink per week. She reports that she does not use drugs.  Allergies: Allergies  Allergen Reactions   Penicillins     REACTION: hives    Family History:  Family History  Problem Relation Age of Onset   Alcohol abuse Other    Diabetes Other    Hypertension Other    Prostate cancer Other    Colon cancer Neg Hx      Current Outpatient Medications:    albuterol (VENTOLIN HFA) 108 (90 Base) MCG/ACT inhaler,  INHALE 1-2 PUFFS INTO THE LUNGS EVERY 6 (SIX) HOURS AS NEEDED FOR WHEEZING OR SHORTNESS OF BREATH., Disp: 18 each, Rfl: 2   Calcium-Vitamin D-Vitamin K (CALCIUM + D + K PO), Take by mouth daily., Disp: , Rfl:    cetirizine (ZYRTEC) 10 MG tablet, TAKE 1 TABLET BY MOUTH EVERY DAY AS NEEDED, Disp: 90 tablet, Rfl: 1   conjugated estrogens (PREMARIN) vaginal cream, Place vaginally once a week., Disp: 42.5 g, Rfl: 11   Docusate Calcium (STOOL SOFTENER PO), Take by mouth as needed., Disp: , Rfl:    fluocinonide cream (LIDEX) 0.05 %, APPLY ONE APPLICATION TOPICALLY TWO TIMES DAILY AS NEEDED, Disp: 60 g, Rfl: 3   fluticasone (FLONASE) 50 MCG/ACT nasal spray, Place 2 sprays into both nostrils daily as needed., Disp: 32 g, Rfl: 11   Hyprom-Naphaz-Polysorb-Zn Sulf (CLEAR EYES COMPLETE OP), Apply to eye., Disp: , Rfl:    MELATONIN ER PO, Take by mouth as needed., Disp: , Rfl:    triamterene-hydrochlorothiazide (MAXZIDE) 75-50 MG tablet, TAKE ONE HALF TAB EVERY MORNING, Disp: 45 tablet, Rfl: 4  Review of Systems:  Constitutional: Denies fever, chills, diaphoresis, appetite change and fatigue.  HEENT: Denies photophobia, eye pain, redness, hearing loss, ear pain, congestion, sore throat, rhinorrhea, sneezing, mouth sores, trouble swallowing, neck pain, neck stiffness and tinnitus.   Respiratory: Denies SOB, DOE, cough, chest tightness,  and wheezing.  Cardiovascular: Denies chest pain, palpitations and leg swelling.  Gastrointestinal: Denies nausea, vomiting, abdominal pain, diarrhea, constipation, blood in stool and abdominal distention.  Genitourinary: Denies dysuria, urgency, frequency, hematuria, flank pain and difficulty urinating.  Endocrine: Denies: hot or cold intolerance, sweats, changes in hair or nails, polyuria, polydipsia. Musculoskeletal: Denies myalgias, back pain, joint swelling, arthralgias and gait problem.  Skin: Denies pallor, rash and wound.  Neurological: Denies dizziness, seizures,  syncope, weakness, light-headedness, numbness and headaches.  Hematological: Denies adenopathy. Easy bruising, personal or family bleeding history  Psychiatric/Behavioral: Denies suicidal ideation, mood changes, confusion, nervousness, sleep disturbance and agitation    Physical Exam: Vitals:   09/26/21 1041  BP: 120/70  Pulse: 70  Temp: 98.4 F (36.9 C)  TempSrc: Oral  SpO2: 99%  Weight: 149 lb 14.4 oz (68 kg)    Body mass index is 24.94 kg/m.   Constitutional: NAD, calm, comfortable Eyes: PERRL, lids and conjunctivae normal ENMT: Mucous membranes are moist.  Respiratory: clear to auscultation bilaterally, no wheezing, no crackles. Normal respiratory effort. No accessory muscle use.  Cardiovascular: Regular rate and rhythm, no murmurs / rubs / gallops. No extremity edema.  Neurologic: Grossly intact and nonfocal Psychiatric: Normal judgment and insight. Alert and oriented x 3. Normal mood.    Impression and Plan:  IGT (impaired glucose tolerance)  - Plan: POCT glycosylated hemoglobin (Hb A1C) -A1c has increased to 6.3, still in the prediabetic range. -We have discussed healthy lifestyle changes, recheck when she returns for CPE in 6 months.  Vitamin D deficiency -Check levels when she returns for lab work.  Mixed hyperlipidemia -Last lipid panel in April 2022 with a total cholesterol of 204, triglycerides 69 and LDL 124.  She is not on medication.  Essential hypertension -Well-controlled.  Time spent: 31 minutes reviewing chart, interviewing and examining patient and formulating plan of care.    Lelon Frohlich, MD [facility]

## 2021-11-14 ENCOUNTER — Encounter: Payer: Self-pay | Admitting: Internal Medicine

## 2021-11-14 DIAGNOSIS — Z880 Allergy status to penicillin: Secondary | ICD-10-CM | POA: Diagnosis not present

## 2021-11-14 DIAGNOSIS — Z833 Family history of diabetes mellitus: Secondary | ICD-10-CM | POA: Diagnosis not present

## 2021-11-14 DIAGNOSIS — J45909 Unspecified asthma, uncomplicated: Secondary | ICD-10-CM | POA: Diagnosis not present

## 2021-11-14 DIAGNOSIS — Z8249 Family history of ischemic heart disease and other diseases of the circulatory system: Secondary | ICD-10-CM | POA: Diagnosis not present

## 2021-11-14 DIAGNOSIS — I10 Essential (primary) hypertension: Secondary | ICD-10-CM | POA: Diagnosis not present

## 2021-11-14 DIAGNOSIS — E785 Hyperlipidemia, unspecified: Secondary | ICD-10-CM | POA: Diagnosis not present

## 2021-11-14 DIAGNOSIS — H409 Unspecified glaucoma: Secondary | ICD-10-CM | POA: Diagnosis not present

## 2021-11-16 DIAGNOSIS — H40003 Preglaucoma, unspecified, bilateral: Secondary | ICD-10-CM | POA: Diagnosis not present

## 2021-12-19 ENCOUNTER — Telehealth: Payer: Medicare PPO | Admitting: Physician Assistant

## 2021-12-19 DIAGNOSIS — B9689 Other specified bacterial agents as the cause of diseases classified elsewhere: Secondary | ICD-10-CM

## 2021-12-19 DIAGNOSIS — J019 Acute sinusitis, unspecified: Secondary | ICD-10-CM

## 2021-12-19 MED ORDER — DOXYCYCLINE HYCLATE 100 MG PO TABS: 100.0000 mg | ORAL_TABLET | Freq: Two times a day (BID) | ORAL | 0 refills | Status: DC

## 2021-12-19 MED ORDER — BENZONATATE 100 MG PO CAPS: 100.0000 mg | ORAL_CAPSULE | Freq: Three times a day (TID) | ORAL | 0 refills | Status: DC | PRN

## 2021-12-19 NOTE — Progress Notes (Signed)
Virtual Visit Consent   Wendy Salazar, you are scheduled for a virtual visit with a [facility] provider today.     Just as with appointments in the office, your consent must be obtained to participate.  Your consent will be active for this visit and any virtual visit you may have with one of our providers in the next 365 days.     If you have a MyChart account, a copy of this consent can be sent to you electronically.  All virtual visits are billed to your insurance company just like a traditional visit in the office.    As this is a virtual visit, video technology does not allow for your provider to perform a traditional examination.  This may limit your provider's ability to fully assess your condition.  If your provider identifies any concerns that need to be evaluated in person or the need to arrange testing (such as labs, EKG, etc.), we will make arrangements to do so.     Although advances in technology are sophisticated, we cannot ensure that it will always work on either your end or our end.  If the connection with a video visit is poor, the visit may have to be switched to a telephone visit.  With either a video or telephone visit, we are not always able to ensure that we have a secure connection.     I need to obtain your verbal consent now.   Are you willing to proceed with your visit today?    Wendy Salazar has provided verbal consent on 12/19/2021 for a virtual visit (video or telephone).   Leeanne Rio, Wendy   Date: 12/19/2021 11:11 AM   Virtual Visit via Video Note   I, Leeanne Rio, connected with  Wendy Salazar  ([id number], 15-Jun-1946) on 12/19/21 at 11:15 AM EST by a video-enabled telemedicine application and verified that I am speaking with the correct person using two identifiers.  Location: Patient: Virtual Visit Location Patient: Home Provider: Virtual Visit Location Provider: Home Office   I discussed the limitations of evaluation and  management by telemedicine and the availability of in person appointments. The patient expressed understanding and agreed to proceed.    History of Present Illness: Wendy Salazar is a 76 y.o. who identifies as a female who was assigned female at birth, and is being seen today for cough and head congestion for the past 1-2 weeks. Notes initially though allergy-related or mild cold as she had runny nose, itchy eyes and mild congestion. This has subsided but now with head congestion, headache, sinus pain, ear pressure and continued dry but persistent cough. Also noted history of GERD for which she takes OTC PPI. Denies any breakthrough heartburn but does not occasional globus sensation. Denies SOB or chest pain. Denies abdominal pain. Has taken OTC Mucinex and started Zyrtec last night.    HPI: HPI  Problems:  Patient Active Problem List   Diagnosis Date Noted   Vitamin D deficiency 02/10/2021   Abnormal TSH 02/10/2021   IGT (impaired glucose tolerance) 02/10/2021   Hyperlipidemia 02/10/2021   Screening for cervical cancer 11/19/2017   Routine general medical examination at a health care facility 11/27/2016   Cervical polyp 11/03/2014   Encounter for routine gynecological examination 08/27/2011   Vaginitis, atrophic 08/14/2011   Contact dermatitis 03/02/2008   Essential hypertension 05/29/2007   Allergic rhinitis 05/29/2007   URINARY INCONTINENCE 05/29/2007    Allergies:  Allergies  Allergen Reactions  Penicillins     REACTION: hives   Medications:  Current Outpatient Medications:    benzonatate (TESSALON) 100 MG capsule, Take 1 capsule (100 mg total) by mouth 3 (three) times daily as needed for cough., Disp: 30 capsule, Rfl: 0   doxycycline (VIBRA-TABS) 100 MG tablet, Take 1 tablet (100 mg total) by mouth 2 (two) times daily., Disp: 20 tablet, Rfl: 0   albuterol (VENTOLIN HFA) 108 (90 Base) MCG/ACT inhaler, INHALE 1-2 PUFFS INTO THE LUNGS EVERY 6 (SIX) HOURS AS NEEDED FOR  WHEEZING OR SHORTNESS OF BREATH., Disp: 18 each, Rfl: 2   Calcium-Vitamin D-Vitamin K (CALCIUM + D + K PO), Take by mouth daily., Disp: , Rfl:    cetirizine (ZYRTEC) 10 MG tablet, TAKE 1 TABLET BY MOUTH EVERY DAY AS NEEDED, Disp: 90 tablet, Rfl: 1   conjugated estrogens (PREMARIN) vaginal cream, Place vaginally once a week., Disp: 42.5 g, Rfl: 11   Docusate Calcium (STOOL SOFTENER PO), Take by mouth as needed., Disp: , Rfl:    fluocinonide cream (LIDEX) 0.05 %, APPLY ONE APPLICATION TOPICALLY TWO TIMES DAILY AS NEEDED, Disp: 60 g, Rfl: 3   fluticasone (FLONASE) 50 MCG/ACT nasal spray, Place 2 sprays into both nostrils daily as needed., Disp: 32 g, Rfl: 11   Hyprom-Naphaz-Polysorb-Zn Sulf (CLEAR EYES COMPLETE OP), Apply to eye., Disp: , Rfl:    MELATONIN ER PO, Take by mouth as needed., Disp: , Rfl:    triamterene-hydrochlorothiazide (MAXZIDE) 75-50 MG tablet, TAKE ONE HALF TAB EVERY MORNING, Disp: 45 tablet, Rfl: 4  Observations/Objective: Patient is well-developed, well-nourished in no acute distress.  Resting comfortably at home.  Head is normocephalic, atraumatic.  No labored breathing. Speech is clear and coherent with logical content.  Patient is alert and oriented at baseline.   Assessment and Plan: 1. Acute bacterial sinusitis - benzonatate (TESSALON) 100 MG capsule; Take 1 capsule (100 mg total) by mouth 3 (three) times daily as needed for cough.  Dispense: 30 capsule; Refill: 0 - doxycycline (VIBRA-TABS) 100 MG tablet; Take 1 tablet (100 mg total) by mouth 2 (two) times daily.  Dispense: 20 tablet; Refill: 0  Causing flare in her GERD but thankfully mildly so thus far. Rx Doxycycline.  Increase fluids.  Rest.  Saline nasal spray.  Probiotic.  Mucinex as directed.  Humidifier in bedroom. Restart Flonase. Restart GERD diet. OTC PPI use. Tessalon per orders.  Call or return to clinic if symptoms are not improving.   Follow Up Instructions: I discussed the assessment and treatment  plan with the patient. The patient was provided an opportunity to ask questions and all were answered. The patient agreed with the plan and demonstrated an understanding of the instructions.  A copy of instructions were sent to the patient via MyChart unless otherwise noted below.    The patient was advised to call back or seek an in-person evaluation if the symptoms worsen or if the condition fails to improve as anticipated.  Time:  I spent 12 minutes with the patient via telehealth technology discussing the above problems/concerns.    Leeanne Rio, PA-C

## 2021-12-19 NOTE — Patient Instructions (Signed)
Wendy Salazar, thank you for joining Leeanne Rio, PA-C for today's virtual visit.  While this provider is not your primary care provider (PCP), if your PCP is located in our provider database this encounter information will be shared with them immediately following your visit.  Consent: (Patient) Wendy Salazar provided verbal consent for this virtual visit at the beginning of the encounter.  Current Medications:  Current Outpatient Medications:    benzonatate (TESSALON) 100 MG capsule, Take 1 capsule (100 mg total) by mouth 3 (three) times daily as needed for cough., Disp: 30 capsule, Rfl: 0   doxycycline (VIBRA-TABS) 100 MG tablet, Take 1 tablet (100 mg total) by mouth 2 (two) times daily., Disp: 20 tablet, Rfl: 0   albuterol (VENTOLIN HFA) 108 (90 Base) MCG/ACT inhaler, INHALE 1-2 PUFFS INTO THE LUNGS EVERY 6 (SIX) HOURS AS NEEDED FOR WHEEZING OR SHORTNESS OF BREATH., Disp: 18 each, Rfl: 2   Calcium-Vitamin D-Vitamin K (CALCIUM + D + K PO), Take by mouth daily., Disp: , Rfl:    cetirizine (ZYRTEC) 10 MG tablet, TAKE 1 TABLET BY MOUTH EVERY DAY AS NEEDED, Disp: 90 tablet, Rfl: 1   conjugated estrogens (PREMARIN) vaginal cream, Place vaginally once a week., Disp: 42.5 g, Rfl: 11   Docusate Calcium (STOOL SOFTENER PO), Take by mouth as needed., Disp: , Rfl:    fluocinonide cream (LIDEX) 0.05 %, APPLY ONE APPLICATION TOPICALLY TWO TIMES DAILY AS NEEDED, Disp: 60 g, Rfl: 3   fluticasone (FLONASE) 50 MCG/ACT nasal spray, Place 2 sprays into both nostrils daily as needed., Disp: 32 g, Rfl: 11   Hyprom-Naphaz-Polysorb-Zn Sulf (CLEAR EYES COMPLETE OP), Apply to eye., Disp: , Rfl:    MELATONIN ER PO, Take by mouth as needed., Disp: , Rfl:    triamterene-hydrochlorothiazide (MAXZIDE) 75-50 MG tablet, TAKE ONE HALF TAB EVERY MORNING, Disp: 45 tablet, Rfl: 4   Medications ordered in this encounter:  Meds ordered this encounter  Medications   benzonatate (TESSALON) 100 MG capsule     Sig: Take 1 capsule (100 mg total) by mouth 3 (three) times daily as needed for cough.    Dispense:  30 capsule    Refill:  0    Order Specific Question:   Supervising Provider    Answer:   Sabra Heck, BRIAN [[id number]]   doxycycline (VIBRA-TABS) 100 MG tablet    Sig: Take 1 tablet (100 mg total) by mouth 2 (two) times daily.    Dispense:  20 tablet    Refill:  0    Order Specific Question:   Supervising Provider    Answer:   Sabra Heck, BRIAN [[id number]]     *If you need refills on other medications prior to your next appointment, please contact your pharmacy*  Follow-Up: Call back or seek an in-person evaluation if the symptoms worsen or if the condition fails to improve as anticipated.  Other Instructions Please take antibiotic as directed.  Increase fluid intake.  Use Saline nasal spray.  Take a daily multivitamin. Use OTC Flonase. Continue the Zyrtec and plain Mucinex. I have sent in Tessalon to help with cough.  Place a humidifier in the bedroom.  Please call or return clinic if symptoms are not improving.  Sinusitis Sinusitis is redness, soreness, and swelling (inflammation) of the paranasal sinuses. Paranasal sinuses are air pockets within the bones of your face (beneath the eyes, the middle of the forehead, or above the eyes). In healthy paranasal sinuses, mucus is able to drain out, and air is  able to circulate through them by way of your nose. However, when your paranasal sinuses are inflamed, mucus and air can become trapped. This can allow bacteria and other germs to grow and cause infection. Sinusitis can develop quickly and last only a short time (acute) or continue over a long period (chronic). Sinusitis that lasts for more than 12 weeks is considered chronic.  CAUSES  Causes of sinusitis include: Allergies. Structural abnormalities, such as displacement of the cartilage that separates your nostrils (deviated septum), which can decrease the air flow through your nose and sinuses and affect  sinus drainage. Functional abnormalities, such as when the small hairs (cilia) that line your sinuses and help remove mucus do not work properly or are not present. SYMPTOMS  Symptoms of acute and chronic sinusitis are the same. The primary symptoms are pain and pressure around the affected sinuses. Other symptoms include: Upper toothache. Earache. Headache. Bad breath. Decreased sense of smell and taste. A cough, which worsens when you are lying flat. Fatigue. Fever. Thick drainage from your nose, which often is green and may contain pus (purulent). Swelling and warmth over the affected sinuses. DIAGNOSIS  Your caregiver will perform a physical exam. During the exam, your caregiver may: Look in your nose for signs of abnormal growths in your nostrils (nasal polyps). Tap over the affected sinus to check for signs of infection. View the inside of your sinuses (endoscopy) with a special imaging device with a light attached (endoscope), which is inserted into your sinuses. If your caregiver suspects that you have chronic sinusitis, one or more of the following tests may be recommended: Allergy tests. Nasal culture A sample of mucus is taken from your nose and sent to a lab and screened for bacteria. Nasal cytology A sample of mucus is taken from your nose and examined by your caregiver to determine if your sinusitis is related to an allergy. TREATMENT  Most cases of acute sinusitis are related to a viral infection and will resolve on their own within 10 days. Sometimes medicines are prescribed to help relieve symptoms (pain medicine, decongestants, nasal steroid sprays, or saline sprays).  However, for sinusitis related to a bacterial infection, your caregiver will prescribe antibiotic medicines. These are medicines that will help kill the bacteria causing the infection.  Rarely, sinusitis is caused by a fungal infection. In theses cases, your caregiver will prescribe antifungal medicine. For  some cases of chronic sinusitis, surgery is needed. Generally, these are cases in which sinusitis recurs more than 3 times per year, despite other treatments. HOME CARE INSTRUCTIONS  Drink plenty of water. Water helps thin the mucus so your sinuses can drain more easily. Use a humidifier. Inhale steam 3 to 4 times a day (for example, sit in the bathroom with the shower running). Apply a warm, moist washcloth to your face 3 to 4 times a day, or as directed by your caregiver. Use saline nasal sprays to help moisten and clean your sinuses. Take over-the-counter or prescription medicines for pain, discomfort, or fever only as directed by your caregiver. SEEK IMMEDIATE MEDICAL CARE IF: You have increasing pain or severe headaches. You have nausea, vomiting, or drowsiness. You have swelling around your face. You have vision problems. You have a stiff neck. You have difficulty breathing. MAKE SURE YOU:  Understand these instructions. Will watch your condition. Will get help right away if you are not doing well or get worse. Document Released: 10/08/2005 Document Revised: 12/31/2011 Document Reviewed: 10/23/2011 [employer] Patient Information 2014  [employer].    If you have been instructed to have an in-person evaluation today at a local Urgent Care facility, please use the link below. It will take you to a list of all of our available [facility], including address, phone number and hours of operation. Please do not delay care.  [facility]  If you or a family member do not have a primary care provider, use the link below to schedule a visit and establish care. When you choose a [facility] primary care physician or advanced practice provider, you gain a long-term partner in health. Find a Primary Care Provider  Learn more about [facility] in-office and virtual care options: [facility]

## 2022-03-27 ENCOUNTER — Other Ambulatory Visit: Payer: Self-pay | Admitting: Internal Medicine

## 2022-03-27 DIAGNOSIS — I10 Essential (primary) hypertension: Secondary | ICD-10-CM

## 2022-04-04 ENCOUNTER — Ambulatory Visit (INDEPENDENT_AMBULATORY_CARE_PROVIDER_SITE_OTHER): Payer: Medicare PPO | Admitting: Internal Medicine

## 2022-04-04 VITALS — BP 106/64 | HR 67 | Temp 99.0°F | Ht 65.0 in | Wt 144.2 lb

## 2022-04-04 DIAGNOSIS — E782 Mixed hyperlipidemia: Secondary | ICD-10-CM | POA: Diagnosis not present

## 2022-04-04 DIAGNOSIS — E559 Vitamin D deficiency, unspecified: Secondary | ICD-10-CM | POA: Diagnosis not present

## 2022-04-04 DIAGNOSIS — Z0000 Encounter for general adult medical examination without abnormal findings: Secondary | ICD-10-CM | POA: Diagnosis not present

## 2022-04-04 DIAGNOSIS — R220 Localized swelling, mass and lump, head: Secondary | ICD-10-CM

## 2022-04-04 DIAGNOSIS — R7989 Other specified abnormal findings of blood chemistry: Secondary | ICD-10-CM | POA: Diagnosis not present

## 2022-04-04 DIAGNOSIS — I10 Essential (primary) hypertension: Secondary | ICD-10-CM | POA: Diagnosis not present

## 2022-04-04 DIAGNOSIS — R7302 Impaired glucose tolerance (oral): Secondary | ICD-10-CM

## 2022-04-04 LAB — COMPREHENSIVE METABOLIC PANEL
ALT: 20 U/L (ref 0–35)
AST: 24 U/L (ref 0–37)
Albumin: 4.4 g/dL (ref 3.5–5.2)
Alkaline Phosphatase: 70 U/L (ref 39–117)
BUN: 24 mg/dL — ABNORMAL HIGH (ref 6–23)
CO2: 33 mEq/L — ABNORMAL HIGH (ref 19–32)
Calcium: 10.2 mg/dL (ref 8.4–10.5)
Chloride: 99 mEq/L (ref 96–112)
Creatinine, Ser: 0.97 mg/dL (ref 0.40–1.20)
GFR: 56.16 mL/min — ABNORMAL LOW (ref >60.00)
Glucose, Bld: 87 mg/dL (ref 70–99)
Potassium: 3.7 mEq/L (ref 3.5–5.1)
Sodium: 140 mEq/L (ref 135–145)
Total Bilirubin: 0.8 mg/dL (ref 0.2–1.2)
Total Protein: 7.7 g/dL (ref 6.0–8.3)

## 2022-04-04 LAB — LIPID PANEL
Cholesterol: 225 mg/dL — ABNORMAL HIGH (ref 0–200)
HDL: 75.3 mg/dL (ref >39.00)
LDL Cholesterol: 140 mg/dL — ABNORMAL HIGH (ref 0–99)
NonHDL: 149.53
Total CHOL/HDL Ratio: 3
Triglycerides: 50 mg/dL (ref 0.0–149.0)
VLDL: 10 mg/dL (ref 0.0–40.0)

## 2022-04-04 LAB — CBC WITH DIFFERENTIAL/PLATELET
Basophils Absolute: 0 10*3/uL (ref 0.0–0.1)
Basophils Relative: 0.9 % (ref 0.0–3.0)
Eosinophils Absolute: 0.3 10*3/uL (ref 0.0–0.7)
Eosinophils Relative: 7.1 % — ABNORMAL HIGH (ref 0.0–5.0)
HCT: 39.8 % (ref 36.0–46.0)
Hemoglobin: 13.2 g/dL (ref 12.0–15.0)
Lymphocytes Relative: 37.9 % (ref 12.0–46.0)
Lymphs Abs: 1.4 10*3/uL (ref 0.7–4.0)
MCHC: 33.1 g/dL (ref 30.0–36.0)
MCV: 90.8 fl (ref 78.0–100.0)
Monocytes Absolute: 0.6 10*3/uL (ref 0.1–1.0)
Monocytes Relative: 15 % — ABNORMAL HIGH (ref 3.0–12.0)
Neutro Abs: 1.5 10*3/uL (ref 1.4–7.7)
Neutrophils Relative %: 39.1 % — ABNORMAL LOW (ref 43.0–77.0)
Platelets: 231 10*3/uL (ref 150.0–400.0)
RBC: 4.38 Mil/uL (ref 3.87–5.11)
RDW: 13.3 % (ref 11.5–15.5)
WBC: 3.8 10*3/uL — ABNORMAL LOW (ref 4.0–10.5)

## 2022-04-04 LAB — VITAMIN B12: Vitamin B-12: >1504 pg/mL — ABNORMAL HIGH (ref 211–911)

## 2022-04-04 LAB — TSH: TSH: 3.09 u[IU]/mL (ref 0.35–5.50)

## 2022-04-04 LAB — HEMOGLOBIN A1C: Hgb A1c MFr Bld: 6.1 % (ref 4.6–6.5)

## 2022-04-04 LAB — VITAMIN D 25 HYDROXY (VIT D DEFICIENCY, FRACTURES): VITD: 53.78 ng/mL (ref 30.00–100.00)

## 2022-04-04 MED ORDER — PREDNISONE 10 MG (21) PO TBPK: ORAL_TABLET | ORAL | 0 refills | Status: DC

## 2022-04-04 NOTE — Progress Notes (Signed)
Established Patient Office Visit     CC/Reason for Visit: Annual preventive exam and subsequent Medicare wellness visit  HPI: Wendy Salazar is a 76 y.o. female who is coming in today for the above mentioned reasons. Past Medical History is significant for: Hypertension, hyperlipidemia, impaired glucose tolerance, vitamin D deficiency, asthma that has been well controlled.  She feels well.  While gardening she came in contact with a weed that she knows she is allergic to and has now developed a swollen lip, this happens to her every year, she has no difficulty breathing or swallowing.  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   Past Medical/Surgical History: Past Medical History:  Diagnosis Date   Allergic eye reaction    Allergic rhinitis    Asthma    Hair loss    HTN (hypertension)    PMS (premenstrual syndrome)    Rectal tenesmus    Urinary incontinence 2005    Past Surgical History:  Procedure Laterality Date   ABDOMINAL SURGERY  2006   Tummy Tuck   AUGMENTATION MAMMAPLASTY Bilateral 2007   BREAST ENHANCEMENT SURGERY  2006   Saline implants   CATARACT EXTRACTION Left 10/28/2013   LIPOSUCTION  2006   OTHER SURGICAL HISTORY     CB X3   TUBAL LIGATION  1985    Social History:  reports that she has never smoked. She has never used smokeless tobacco. She reports current alcohol use of about 1.0 standard drink of alcohol per week. She reports that she does not use drugs.  Allergies: Allergies  Allergen Reactions   Penicillins     REACTION: hives    Family History:  Family History  Problem Relation Age of Onset   Alcohol abuse Other    Diabetes Other    Hypertension Other    Prostate cancer Other    Colon cancer Neg Hx      Current Outpatient Medications:    albuterol (VENTOLIN HFA) 108 (90 Base) MCG/ACT  inhaler, INHALE 1-2 PUFFS INTO THE LUNGS EVERY 6 (SIX) HOURS AS NEEDED FOR WHEEZING OR SHORTNESS OF BREATH., Disp: 18 each, Rfl: 2   Calcium-Vitamin D-Vitamin K (CALCIUM + D + K PO), Take by mouth daily., Disp: , Rfl:    cetirizine (ZYRTEC) 10 MG tablet, TAKE 1 TABLET BY MOUTH EVERY DAY AS NEEDED, Disp: 90 tablet, Rfl: 1   conjugated estrogens (PREMARIN) vaginal cream, Place vaginally once a week., Disp: 42.5 g, Rfl: 11   Docusate Calcium (STOOL SOFTENER PO), Take by mouth as needed., Disp: , Rfl:    fluocinonide cream (LIDEX) 0.05 %, APPLY ONE APPLICATION TOPICALLY TWO TIMES DAILY AS NEEDED, Disp: 60 g, Rfl: 3   fluticasone (FLONASE) 50 MCG/ACT nasal spray, Place 2 sprays into both nostrils daily as needed., Disp: 32 g, Rfl: 11   MELATONIN ER PO, Take by mouth as needed., Disp: , Rfl:    predniSONE (STERAPRED UNI-PAK 21 TAB) 10 MG (21) TBPK tablet, Take as directed, Disp: 21 tablet, Rfl: 0   timolol (TIMOPTIC) 0.5 % ophthalmic solution, 1 drop every morning., Disp: , Rfl:    triamterene-hydrochlorothiazide (MAXZIDE) 75-50 MG tablet, TAKE ONE HALF TAB EVERY MORNING, Disp: 45 tablet, Rfl: 1   benzonatate (TESSALON) 100 MG capsule, Take 1 capsule (100 mg total) by mouth 3 (three) times daily as  needed for cough. (Patient not taking: Reported on 04/04/2022), Disp: 30 capsule, Rfl: 0   doxycycline (VIBRA-TABS) 100 MG tablet, Take 1 tablet (100 mg total) by mouth 2 (two) times daily. (Patient not taking: Reported on 04/04/2022), Disp: 20 tablet, Rfl: 0   Hyprom-Naphaz-Polysorb-Zn Sulf (CLEAR EYES COMPLETE OP), Apply to eye. (Patient not taking: Reported on 04/04/2022), Disp: , Rfl:   Review of Systems:  Constitutional: Denies fever, chills, diaphoresis, appetite change and fatigue.  HEENT: Denies photophobia, eye pain, redness, hearing loss, ear pain, congestion, sore throat, rhinorrhea, sneezing, mouth sores, trouble swallowing, neck pain, neck stiffness and tinnitus.   Respiratory: Denies SOB, DOE,  cough, chest tightness,  and wheezing.   Cardiovascular: Denies chest pain, palpitations and leg swelling.  Gastrointestinal: Denies nausea, vomiting, abdominal pain, diarrhea, constipation, blood in stool and abdominal distention.  Genitourinary: Denies dysuria, urgency, frequency, hematuria, flank pain and difficulty urinating.  Endocrine: Denies: hot or cold intolerance, sweats, changes in hair or nails, polyuria, polydipsia. Musculoskeletal: Denies myalgias, back pain, joint swelling, arthralgias and gait problem.  Skin: Denies pallor, rash and wound.  Neurological: Denies dizziness, seizures, syncope, weakness, light-headedness, numbness and headaches.  Hematological: Denies adenopathy. Easy bruising, personal or family bleeding history  Psychiatric/Behavioral: Denies suicidal ideation, mood changes, confusion, nervousness, sleep disturbance and agitation    Physical Exam: Vitals:   04/04/22 1100  BP: 106/64  Pulse: 67  Temp: 99 F (37.2 C)  TempSrc: Oral  SpO2: 92%  Weight: 144 lb 3.2 oz (65.4 kg)  Height: '5\' 5"'$  (1.651 m)    Body mass index is 24 kg/m.   Constitutional: NAD, calm, comfortable Eyes: PERRL, lids and conjunctivae normal ENMT: Mucous membranes are moist.  Swollen top and lower lips posterior pharynx clear of any exudate or lesions. Normal dentition. Tympanic membrane is pearly white, no erythema or bulging. Neck: normal, supple, no masses, no thyromegaly Respiratory: clear to auscultation bilaterally, no wheezing, no crackles. Normal respiratory effort. No accessory muscle use.  Cardiovascular: Regular rate and rhythm, no murmurs / rubs / gallops. No extremity edema. 2+ pedal pulses. No carotid bruits.  Abdomen: no tenderness, no masses palpated. No hepatosplenomegaly. Bowel sounds positive.  Musculoskeletal: no clubbing / cyanosis. No joint deformity upper and lower extremities. Good ROM, no contractures. Normal muscle tone.  Skin: no rashes, lesions, ulcers.  No induration Neurologic: CN 2-12 grossly intact. Sensation intact, DTR normal. Strength 5/5 in all 4.  Psychiatric: Normal judgment and insight. Alert and oriented x 3. Normal mood.    Subsequent Medicare wellness visit   1. Risk factors, based on past  M,S,F -cardiovascular disease risk factors include age, history of hypertension and hyperlipidemia   2.  Physical activities: She remains active with gardening and walking on a daily basis   3.  Depression/mood: Stable, not depressed   4.  Hearing: Chronic hearing difficulty on the left not interested in audiology referral   5.  ADL's: Independent in all ADLs   6.  Fall risk: Low fall risk   7.  Home safety: No problems identified   8.  Height weight, and visual acuity: height and weight as above, vision:  Vision Screening   Right eye Left eye Both eyes  Without correction     With correction '20/32 20/32 20/32 '$     9.  Counseling: Advised to update vaccination status   10. Lab orders based on risk factors: Laboratory update will be reviewed   11. Referral : None today   12. Care plan:  Follow-up with me in 6 months   13. Cognitive assessment: No cognitive impairment   14. Screening: Patient provided with a written and personalized 5-10 year screening schedule in the AVS. yes   15. Provider List Update: PCP only  16. Advance Directives: Full code   17. Opioids: Patient is not on any opioid prescriptions and has no risk factors for a substance use disorder.   [place] Office Visit from 04/04/2022 in [facility] [place]  PHQ-9 Total Score 0          11/14/2015    8:55 AM 11/27/2016    8:59 AM 03/21/2020    1:55 PM 02/10/2021   10:01 AM 04/04/2022   11:06 AM  Fall Risk  Falls in the past year? No No  0 0  Was there an injury with Fall?    0 0  Fall Risk Category Calculator    0 0  Fall Risk Category    Low Low  Patient Fall Risk Level   Low fall risk  Low fall risk  Patient at Risk for Falls  Due to     No Fall Risks  Fall risk Follow up     Falls evaluation completed     Impression and Plan:  Encounter for preventive health examination -Recommend routine eye and dental care. -Immunizations: She will get bivalent COVID at pharmacy, other immunizations are up-to-date -Healthy lifestyle discussed in detail. -Labs to be updated today. -Colon cancer screening: 10/2013, 10-year follow-up -Breast cancer screening: 03/2021 -Cervical cancer screening: Declines due to age -Lung cancer screening: Not applicable -Prostate cancer screening: Not applicable -DEXA: 06/7672  Vitamin D deficiency  - Plan: VITAMIN D 25 Hydroxy (Vit-D Deficiency, Fractures), VITAMIN D 25 Hydroxy (Vit-D Deficiency, Fractures)  Abnormal TSH  - Plan: TSH, Vitamin B12, Vitamin B12, TSH  IGT (impaired glucose tolerance)  - Plan: Hemoglobin A1c, Hemoglobin A1c -Last A1c was 6.3 in December 2022  Mixed hyperlipidemia  - Plan: Lipid panel, Lipid panel  Essential hypertension  - Plan: CBC with Differential/Platelet, Comprehensive metabolic panel, Comprehensive metabolic panel, CBC with Differential/Platelet -Blood pressure is very well controlled.  Swelling of face  - Plan: predniSONE (STERAPRED UNI-PAK 21 TAB) 10 MG (21) TBPK tablet    Patient Instructions  -Nice seeing you today!!  -Lab work today; will notify you once results are available.  -COVID vaccine at the pharmacy.  -Schedule follow up in 6 months.      Lelon Frohlich, MD [facility] at [facility]

## 2022-04-04 NOTE — Patient Instructions (Signed)
-  Nice seeing you today!!  -Lab work today; will notify you once results are available.  -COVID vaccine at the pharmacy.  -Schedule follow up in 6 months.

## 2022-04-05 ENCOUNTER — Other Ambulatory Visit: Payer: Self-pay | Admitting: Internal Medicine

## 2022-04-05 DIAGNOSIS — E782 Mixed hyperlipidemia: Secondary | ICD-10-CM

## 2022-04-05 MED ORDER — ATORVASTATIN CALCIUM 20 MG PO TABS: 20.0000 mg | ORAL_TABLET | Freq: Every day | ORAL | 1 refills | Status: DC

## 2022-04-09 ENCOUNTER — Other Ambulatory Visit: Payer: Self-pay | Admitting: *Deleted

## 2022-04-09 DIAGNOSIS — E782 Mixed hyperlipidemia: Secondary | ICD-10-CM

## 2022-05-17 ENCOUNTER — Other Ambulatory Visit: Payer: Self-pay | Admitting: Internal Medicine

## 2022-05-17 DIAGNOSIS — Z1231 Encounter for screening mammogram for malignant neoplasm of breast: Secondary | ICD-10-CM

## 2022-06-12 ENCOUNTER — Ambulatory Visit
Admission: RE | Admit: 2022-06-12 | Discharge: 2022-06-12 | Disposition: A | Discharge status: Home / Self Care | Payer: Medicare PPO | Source: Ambulatory Visit | Attending: Internal Medicine | Admitting: Internal Medicine

## 2022-06-12 DIAGNOSIS — H40003 Preglaucoma, unspecified, bilateral: Secondary | ICD-10-CM | POA: Diagnosis not present

## 2022-06-12 DIAGNOSIS — Z1231 Encounter for screening mammogram for malignant neoplasm of breast: Secondary | ICD-10-CM

## 2022-07-27 ENCOUNTER — Other Ambulatory Visit: Payer: Self-pay | Admitting: Internal Medicine

## 2022-07-27 DIAGNOSIS — E782 Mixed hyperlipidemia: Secondary | ICD-10-CM

## 2022-08-15 ENCOUNTER — Ambulatory Visit: Payer: Medicare PPO | Admitting: Family Medicine

## 2022-08-15 VITALS — BP 124/72 | HR 52 | Temp 98.5°F | Wt 152.1 lb

## 2022-08-15 DIAGNOSIS — L237 Allergic contact dermatitis due to plants, except food: Secondary | ICD-10-CM

## 2022-08-15 MED ORDER — METHYLPREDNISOLONE ACETATE 40 MG/ML IJ SUSP
40.0000 mg | Freq: Once | INTRAMUSCULAR | Status: AC
  Administered 2022-08-15: 40 mg via INTRAMUSCULAR

## 2022-08-15 MED ORDER — METHYLPREDNISOLONE 4 MG PO TBPK: ORAL_TABLET | ORAL | 0 refills | Status: DC

## 2022-08-15 MED ORDER — METHYLPREDNISOLONE ACETATE 80 MG/ML IJ SUSP
80.0000 mg | Freq: Once | INTRAMUSCULAR | Status: AC
  Administered 2022-08-15: 80 mg via INTRAMUSCULAR

## 2022-08-15 NOTE — Addendum Note (Signed)
Addended by: Wyvonne Lenz on: 08/15/2022 03:32 PM   Modules accepted: Orders
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
## 2022-10-04 ENCOUNTER — Ambulatory Visit: Payer: Medicare PPO | Admitting: Internal Medicine

## 2022-10-04 ENCOUNTER — Encounter: Payer: Self-pay | Admitting: Internal Medicine

## 2022-10-04 VITALS — BP 120/70 | HR 64 | Temp 97.8°F | Wt 151.4 lb

## 2022-10-04 DIAGNOSIS — R7302 Impaired glucose tolerance (oral): Secondary | ICD-10-CM | POA: Diagnosis not present

## 2022-10-04 DIAGNOSIS — E782 Mixed hyperlipidemia: Secondary | ICD-10-CM

## 2022-10-04 DIAGNOSIS — I10 Essential (primary) hypertension: Secondary | ICD-10-CM

## 2022-10-04 LAB — POCT GLYCOSYLATED HEMOGLOBIN (HGB A1C): Hemoglobin A1C: 6.1 % — AB (ref 4.0–5.6)

## 2022-10-04 LAB — LIPID PANEL
Cholesterol: 150 mg/dL (ref 0–200)
HDL: 64.3 mg/dL (ref >39.00)
LDL Cholesterol: 78 mg/dL (ref 0–99)
NonHDL: 86.02
Total CHOL/HDL Ratio: 2
Triglycerides: 42 mg/dL (ref 0.0–149.0)
VLDL: 8.4 mg/dL (ref 0.0–40.0)

## 2022-10-04 NOTE — Progress Notes (Signed)
Established Patient Office Visit     CC/Reason for Visit: Follow-up chronic conditions  HPI: Wendy Salazar is a 76 y.o. female who is coming in today for the above mentioned reasons. Past Medical History is significant for: Hypertension, hyperlipidemia, impaired glucose tolerance.  She is doing well today and has no acute concerns or complaints.  She started taking atorvastatin 20 mg as prescribed and has been tolerating it well.   Past Medical/Surgical History: Past Medical History:  Diagnosis Date   Allergic eye reaction    Allergic rhinitis    Asthma    Hair loss    HTN (hypertension)    PMS (premenstrual syndrome)    Rectal tenesmus    Urinary incontinence 2005    Past Surgical History:  Procedure Laterality Date   ABDOMINAL SURGERY  2006   Tummy Tuck   AUGMENTATION MAMMAPLASTY Bilateral 2007   BREAST ENHANCEMENT SURGERY  2006   Saline implants   CATARACT EXTRACTION Left 10/28/2013   LIPOSUCTION  2006   OTHER SURGICAL HISTORY     CB X3   TUBAL LIGATION  1985    Social History:  reports that she has never smoked. She has never used smokeless tobacco. She reports current alcohol use of about 1.0 standard drink of alcohol per week. She reports that she does not use drugs.  Allergies: Allergies  Allergen Reactions   Penicillins     REACTION: hives    Family History:  Family History  Problem Relation Age of Onset   Alcohol abuse Other    Diabetes Other    Hypertension Other    Prostate cancer Other    Colon cancer Neg Hx      Current Outpatient Medications:    albuterol (VENTOLIN HFA) 108 (90 Base) MCG/ACT inhaler, INHALE 1-2 PUFFS INTO THE LUNGS EVERY 6 (SIX) HOURS AS NEEDED FOR WHEEZING OR SHORTNESS OF BREATH., Disp: 18 each, Rfl: 2   atorvastatin (LIPITOR) 20 MG tablet, TAKE 1 TABLET BY MOUTH EVERY DAY, Disp: 90 tablet, Rfl: 1   benzonatate (TESSALON) 100 MG capsule, Take 1 capsule (100 mg total) by mouth 3 (three) times daily as needed for  cough., Disp: 30 capsule, Rfl: 0   Calcium-Vitamin D-Vitamin K (CALCIUM + D + K PO), Take by mouth daily., Disp: , Rfl:    cetirizine (ZYRTEC) 10 MG tablet, TAKE 1 TABLET BY MOUTH EVERY DAY AS NEEDED, Disp: 90 tablet, Rfl: 1   conjugated estrogens (PREMARIN) vaginal cream, Place vaginally once a week., Disp: 42.5 g, Rfl: 11   Docusate Calcium (STOOL SOFTENER PO), Take by mouth as needed., Disp: , Rfl:    fluocinonide cream (LIDEX) 0.05 %, APPLY ONE APPLICATION TOPICALLY TWO TIMES DAILY AS NEEDED, Disp: 60 g, Rfl: 3   fluticasone (FLONASE) 50 MCG/ACT nasal spray, Place 2 sprays into both nostrils daily as needed., Disp: 32 g, Rfl: 11   Hyprom-Naphaz-Polysorb-Zn Sulf (CLEAR EYES COMPLETE OP), Apply to eye., Disp: , Rfl:    MELATONIN ER PO, Take by mouth as needed., Disp: , Rfl:    methylPREDNISolone (MEDROL DOSEPAK) 4 MG TBPK tablet, As directed, Disp: 21 tablet, Rfl: 0   timolol (TIMOPTIC) 0.5 % ophthalmic solution, 1 drop every morning., Disp: , Rfl:    triamterene-hydrochlorothiazide (MAXZIDE) 75-50 MG tablet, TAKE ONE HALF TAB EVERY MORNING, Disp: 45 tablet, Rfl: 1  Review of Systems:  Constitutional: Denies fever, chills, diaphoresis, appetite change and fatigue.  HEENT: Denies photophobia, eye pain, redness, hearing loss, ear pain,  congestion, sore throat, rhinorrhea, sneezing, mouth sores, trouble swallowing, neck pain, neck stiffness and tinnitus.   Respiratory: Denies SOB, DOE, cough, chest tightness,  and wheezing.   Cardiovascular: Denies chest pain, palpitations and leg swelling.  Gastrointestinal: Denies nausea, vomiting, abdominal pain, diarrhea, constipation, blood in stool and abdominal distention.  Genitourinary: Denies dysuria, urgency, frequency, hematuria, flank pain and difficulty urinating.  Endocrine: Denies: hot or cold intolerance, sweats, changes in hair or nails, polyuria, polydipsia. Musculoskeletal: Denies myalgias, back pain, joint swelling, arthralgias and gait  problem.  Skin: Denies pallor, rash and wound.  Neurological: Denies dizziness, seizures, syncope, weakness, light-headedness, numbness and headaches.  Hematological: Denies adenopathy. Easy bruising, personal or family bleeding history  Psychiatric/Behavioral: Denies suicidal ideation, mood changes, confusion, nervousness, sleep disturbance and agitation    Physical Exam: Vitals:   10/04/22 0959  BP: 120/70  Pulse: 64  Temp: 97.8 F (36.6 C)  TempSrc: Oral  SpO2: 96%  Weight: 151 lb 6.4 oz (68.7 kg)    Body mass index is 25.19 kg/m.   Constitutional: NAD, calm, comfortable Eyes: PERRL, lids and conjunctivae normal ENMT: Mucous membranes are moist.  Respiratory: clear to auscultation bilaterally, no wheezing, no crackles. Normal respiratory effort. No accessory muscle use.  Cardiovascular: Regular rate and rhythm, no murmurs / rubs / gallops. No extremity edema.  Psychiatric: Normal judgment and insight. Alert and oriented x 3. Normal mood.    Impression and Plan:  IGT (impaired glucose tolerance) - Plan: POCT glycosylated hemoglobin (Hb A1C)  Essential hypertension  Mixed hyperlipidemia - Plan: Lipid panel  -Blood pressure is well-controlled.   -A1c remains in the prediabetic range. -She started taking atorvastatin 20 mg 3 months ago in response to an LDL of 140 and total cholesterol of 225.  Check lipids today.  Time spent:31 minutes reviewing chart, interviewing and examining patient and formulating plan of care.      Lelon Frohlich, MD [facility]

## 2022-10-09 ENCOUNTER — Other Ambulatory Visit: Payer: Medicare PPO

## 2022-10-21 ENCOUNTER — Other Ambulatory Visit: Payer: Self-pay | Admitting: Internal Medicine

## 2022-10-21 DIAGNOSIS — I10 Essential (primary) hypertension: Secondary | ICD-10-CM

## 2022-12-07 DIAGNOSIS — H40003 Preglaucoma, unspecified, bilateral: Secondary | ICD-10-CM | POA: Diagnosis not present

## 2022-12-14 DIAGNOSIS — H40003 Preglaucoma, unspecified, bilateral: Secondary | ICD-10-CM | POA: Diagnosis not present

## 2023-04-27 ENCOUNTER — Other Ambulatory Visit: Payer: Self-pay | Admitting: Internal Medicine

## 2023-04-27 DIAGNOSIS — I10 Essential (primary) hypertension: Secondary | ICD-10-CM

## 2023-04-27 DIAGNOSIS — E782 Mixed hyperlipidemia: Secondary | ICD-10-CM

## 2023-05-03 ENCOUNTER — Other Ambulatory Visit: Payer: Self-pay

## 2023-05-03 ENCOUNTER — Ambulatory Visit
Admission: EM | Admit: 2023-05-03 | Discharge: 2023-05-03 | Disposition: A | Discharge status: Home / Self Care | Payer: Medicare PPO | Attending: Family Medicine | Admitting: Family Medicine

## 2023-05-03 DIAGNOSIS — J069 Acute upper respiratory infection, unspecified: Secondary | ICD-10-CM | POA: Diagnosis not present

## 2023-05-03 DIAGNOSIS — W57XXXA Bitten or stung by nonvenomous insect and other nonvenomous arthropods, initial encounter: Secondary | ICD-10-CM | POA: Insufficient documentation

## 2023-05-03 DIAGNOSIS — S00461A Insect bite (nonvenomous) of right ear, initial encounter: Secondary | ICD-10-CM

## 2023-05-03 DIAGNOSIS — Z20822 Contact with and (suspected) exposure to covid-19: Secondary | ICD-10-CM

## 2023-05-03 DIAGNOSIS — U071 COVID-19: Secondary | ICD-10-CM | POA: Insufficient documentation

## 2023-05-03 MED ORDER — DOXYCYCLINE HYCLATE 100 MG PO CAPS: 100.0000 mg | ORAL_CAPSULE | Freq: Two times a day (BID) | ORAL | 0 refills | Status: AC

## 2023-05-03 MED ORDER — BENZONATATE 100 MG PO CAPS: 200.0000 mg | ORAL_CAPSULE | Freq: Three times a day (TID) | ORAL | 0 refills | Status: DC | PRN

## 2023-05-03 NOTE — ED Provider Notes (Signed)
Wendy Salazar    CSN: [id number] Arrival date & time: 05/03/23  1241      History   Chief Complaint Chief Complaint  Patient presents with   Cough   Insect Bite    HPI Wendy Salazar is a 77 y.o. female.   HPI Patient is here with a complaint of cough, runny nose, x 3 days.  Patient recently traveled from [place] and symptoms began after returning home from trip.  She reports she was with large crowds of people however no one has disclosed that they had any illness.  She would like to be COVID tested.  Secondly patient reports that she was out in the yard doing yard work yesterday and this morning noticed and pulled a tick from her right ear and has a small tender bump at the location which she pulled a tick.  Like this area evaluated.  Has any fever, nausea, vomiting shortness of breath or chest tightness. Past Medical History:  Diagnosis Date   Allergic eye reaction    Allergic rhinitis    Asthma    Hair loss    HTN (hypertension)    PMS (premenstrual syndrome)    Rectal tenesmus    Urinary incontinence 2005    Patient Active Problem List   Diagnosis Date Noted   Vitamin D deficiency 02/10/2021   Abnormal TSH 02/10/2021   IGT (impaired glucose tolerance) 02/10/2021   Hyperlipidemia 02/10/2021   Chronic cough 07/05/2020   Laryngopharyngeal reflux (LPR) 07/05/2020   Screening for cervical cancer 11/19/2017   Routine general medical examination at a health [facility] facility 11/27/2016   Cervical polyp 11/03/2014   Encounter for routine gynecological examination 08/27/2011   Vaginitis, atrophic 08/14/2011   Contact dermatitis 03/02/2008   Essential hypertension 05/29/2007   Allergic rhinitis 05/29/2007   URINARY INCONTINENCE 05/29/2007    Past Surgical History:  Procedure Laterality Date   ABDOMINAL SURGERY  2006   Tummy Tuck   AUGMENTATION MAMMAPLASTY Bilateral 2007   BREAST ENHANCEMENT SURGERY  2006   Saline implants   CATARACT EXTRACTION Left  10/28/2013   LIPOSUCTION  2006   OTHER SURGICAL HISTORY     CB X3   TUBAL LIGATION  1985    OB History   No obstetric history on file.      Home Medications    Prior to Admission medications   Medication Sig Start Date End Date Taking? Authorizing Provider  benzonatate (TESSALON) 100 MG capsule Take 2 capsules (200 mg total) by mouth 3 (three) times daily as needed for cough. 05/03/23  Yes Bing Neighbors, NP  Calcium-Vitamin D-Vitamin K (CALCIUM + D + K PO) Take by mouth daily.   Yes [provider]  cetirizine (ZYRTEC) 10 MG tablet TAKE 1 TABLET BY MOUTH EVERY DAY AS NEEDED 08/16/21  Yes Philip Aspen, Limmie Patricia, MD  cetirizine (ZYRTEC) 10 MG tablet Take 10 mg by mouth daily. 07/05/20  Yes [provider]  doxycycline (VIBRAMYCIN) 100 MG capsule Take 1 capsule (100 mg total) by mouth 2 (two) times daily for 7 days. 05/03/23 05/10/23 Yes Bing Neighbors, NP  fluticasone (FLONASE) 50 MCG/ACT nasal spray Place 2 sprays into both nostrils daily as needed. 11/27/16  Yes Roderick Pee, MD  Hyprom-Naphaz-Polysorb-Zn Sulf (CLEAR EYES COMPLETE OP) Apply to eye.   Yes [provider]  pantoprazole (PROTONIX) 40 MG tablet Take 40 mg by mouth 2 (two) times daily. 07/05/20  Yes [provider]  triamterene-hydrochlorothiazide (MAXZIDE) 75-50  MG tablet TAKE HALF TABLET BY MOUTH EVERY MORNING 04/29/23  Yes Philip Aspen, Limmie Patricia, MD  albuterol (VENTOLIN HFA) 108 (90 Base) MCG/ACT inhaler INHALE 1-2 PUFFS INTO THE LUNGS EVERY 6 (SIX) HOURS AS NEEDED FOR WHEEZING OR SHORTNESS OF BREATH. 09/21/20   Philip Aspen, Limmie Patricia, MD  atorvastatin (LIPITOR) 20 MG tablet TAKE 1 TABLET BY MOUTH EVERY DAY 04/29/23   Philip Aspen, Limmie Patricia, MD  conjugated estrogens (PREMARIN) vaginal cream Place vaginally once a week. 11/19/17   Roderick Pee, MD  Docusate Calcium (STOOL SOFTENER PO) Take by mouth as needed.    [provider]  fluocinonide cream (LIDEX) 0.05 %  APPLY ONE APPLICATION TOPICALLY TWO TIMES DAILY AS NEEDED 02/10/21   Philip Aspen, Limmie Patricia, MD  MELATONIN ER PO Take by mouth as needed.    [provider]  methylPREDNISolone (MEDROL DOSEPAK) 4 MG TBPK tablet As directed 08/15/22   Nelwyn Salisbury, MD  timolol (TIMOPTIC) 0.5 % ophthalmic solution 1 drop every morning. 03/27/22   [provider]    Family History Family History  Problem Relation Age of Onset   Alcohol abuse Other    Diabetes Other    Hypertension Other    Prostate cancer Other    Colon cancer Neg Hx     Social History Social History   Tobacco Use   Smoking status: Never   Smokeless tobacco: Never  Vaping Use   Vaping status: Never Used  Substance Use Topics   Alcohol use: Yes    Alcohol/week: 1.0 standard drink of alcohol    Types: 1 drink(s) per week   Drug use: No     Allergies   Penicillins   Review of Systems Review of Systems Pertinent negatives listed in HPI   Physical Exam Triage Vital Signs ED Triage Vitals  Encounter Vitals Group     BP 05/03/23 1323 132/79     Systolic BP Percentile --      Diastolic BP Percentile --      Pulse Rate 05/03/23 1323 64     Resp 05/03/23 1323 18     Temp 05/03/23 1323 99 F (37.2 C)     Temp Source 05/03/23 1323 Oral     SpO2 05/03/23 1323 99 %     Weight 05/03/23 1320 150 lb (68 kg)     Height 05/03/23 1320 5\' 5"  (1.651 m)     Head Circumference --      Peak Flow --      Pain Score 05/03/23 1320 0     Pain Loc --      Pain Education --      Exclude from Growth Chart --    No data found.  Updated Vital Signs BP 132/79 (BP Location: Left Arm)   Pulse 64   Temp 99 F (37.2 C) (Oral)   Resp 18   Ht 5\' 5"  (1.651 m)   Wt 150 lb (68 kg)   SpO2 99%   BMI 24.96 kg/m   Visual Acuity Right Eye Distance:   Left Eye Distance:   Bilateral Distance:    Right Eye Near:   Left Eye Near:    Bilateral Near:     Physical Exam Constitutional:      Appearance: Normal  appearance.  HENT:     Head: Normocephalic and atraumatic.     Right Ear: Hearing, tympanic membrane and ear canal normal.     Left Ear: Hearing, tympanic membrane, ear canal  and external ear normal.     Ears:     Comments: Small palpable which is erythematous noted on the back of the right ear.     Nose: Congestion and rhinorrhea present.     Mouth/Throat:     Mouth: Mucous membranes are moist.     Pharynx: Oropharynx is clear. No oropharyngeal exudate or posterior oropharyngeal erythema.  Eyes:     Pupils: Pupils are equal, round, and reactive to light.  Cardiovascular:     Rate and Rhythm: Normal rate and regular rhythm.  Pulmonary:     Effort: Pulmonary effort is normal.     Breath sounds: Normal breath sounds.  Musculoskeletal:     Cervical back: Normal range of motion.  Lymphadenopathy:     Cervical: No cervical adenopathy.  Skin:    Capillary Refill: Capillary refill takes less than 2 seconds.  Neurological:     General: No focal deficit present.     Mental Status: She is alert.      UC Treatments / Results  Labs (all labs ordered are listed, but only abnormal results are displayed) Labs Reviewed  SARS CORONAVIRUS 2 (TAT 6-24 HRS) - Abnormal; Notable for the following components:      Result Value   SARS Coronavirus 2 POSITIVE (*)    All other components within normal limits    EKG   Radiology No results found.  Procedures Procedures (including critical [facility] time)  Medications Ordered in UC Medications - No data to display  Initial Impression / Assessment and Plan / UC Course  I have reviewed the triage vital signs and the nursing notes.  Pertinent labs & imaging results that were available during my [facility] of the patient were reviewed by me and considered in my medical decision making (see chart for details).    COVID test pending, suspect acute upper respiratory illness treatment symptomatic with benzonatate Perles for cough, hydrate well with  fluids.  Tylenol for any headache or fever and antihistamine over-the-counter for nasal symptoms.  Will start doxycycline for tickborne illness prophylaxis given that patient had a tick on her ear for approximately 24 hours. Strict return precautions given if any of her symptoms worsen or do not improve.  Patient advised that our office will contact her to notify her if her test results are positive. Final Clinical Impressions(s) / UC Diagnoses   Final diagnoses:  Encounter for laboratory testing for COVID-19 virus  Acute upper respiratory infection  Tick bite of ear, right, initial encounter     Discharge Instructions      COVID test pending will result within 24 hours. Symptom management warranted only.  Manage fever with Tylenol and ibuprofen.  Nasal symptoms with over-the-counter antihistamines recommended.  Treatment per discharge medications/discharge instructions.  Red flags/ER precautions given. The most current CDC isolation/quarantine recommendation advised.   Treating with doxycycline due to recent tick bite as prophylaxis against tickborne illnesses.  Take medication with food twice daily for the next 7 days.     ED Prescriptions     Medication Sig Dispense Auth. Provider   doxycycline (VIBRAMYCIN) 100 MG capsule Take 1 capsule (100 mg total) by mouth 2 (two) times daily for 7 days. 14 capsule Bing Neighbors, NP   benzonatate (TESSALON) 100 MG capsule Take 2 capsules (200 mg total) by mouth 3 (three) times daily as needed for cough. 30 capsule Bing Neighbors, NP      PDMP not reviewed this encounter.   Bing Neighbors,  NP 05/07/23 4034

## 2023-05-03 NOTE — ED Triage Notes (Signed)
Here for "Cough" (dry). Started with a runny nose a few days ago. "Recent trip to [place]". Requesting COVID19 test.  Also, "found tick on right ear lobe, I removed it and burned but need the area checked". No fever. No rash.

## 2023-05-03 NOTE — Discharge Instructions (Addendum)
COVID test pending will result within 24 hours. Symptom management warranted only.  Manage fever with Tylenol and ibuprofen.  Nasal symptoms with over-the-counter antihistamines recommended.  Treatment per discharge medications/discharge instructions.  Red flags/ER precautions given. The most current CDC isolation/quarantine recommendation advised.   Treating with doxycycline due to recent tick bite as prophylaxis against tickborne illnesses.  Take medication with food twice daily for the next 7 days.

## 2023-05-04 LAB — SARS CORONAVIRUS 2 (TAT 6-24 HRS): SARS Coronavirus 2: POSITIVE — AB

## 2023-07-19 DIAGNOSIS — H40003 Preglaucoma, unspecified, bilateral: Secondary | ICD-10-CM | POA: Diagnosis not present

## 2023-07-19 DIAGNOSIS — Z961 Presence of intraocular lens: Secondary | ICD-10-CM | POA: Diagnosis not present

## 2023-07-19 DIAGNOSIS — H04123 Dry eye syndrome of bilateral lacrimal glands: Secondary | ICD-10-CM | POA: Diagnosis not present

## 2023-07-25 ENCOUNTER — Ambulatory Visit: Payer: Medicare PPO | Admitting: Family Medicine

## 2023-07-25 VITALS — Ht 65.0 in | Wt 147.0 lb

## 2023-07-25 DIAGNOSIS — Z0000 Encounter for general adult medical examination without abnormal findings: Secondary | ICD-10-CM

## 2023-07-25 NOTE — Patient Instructions (Signed)
I really enjoyed getting to talk with you today! I am available on Tuesdays and Thursdays for virtual visits if you have any questions or concerns, or if I can be of any further assistance.   CHECKLIST FROM ANNUAL WELLNESS VISIT:  -Follow up (please call to schedule if not scheduled after visit):   -yearly for annual wellness visit with primary care office  Here is a list of your preventive care/health maintenance measures and the plan for each if any are due:  PLAN For any measures below that may be due:  -can get your covid updated vaccine 3 months after the covid infection -would recommend getting your flu shot as soon as you can - please let us know when you do so that we can update your record  Health Maintenance  Topic Date Due   INFLUENZA VACCINE  05/23/2023   COVID-19 Vaccine (6 - 2023-24 season) 11/25/2023 (Originally 06/23/2023)   Medicare Annual Wellness (AWV)  07/24/2024   DTaP/Tdap/Td (6 - Td or Tdap) 05/17/2029   Pneumonia Vaccine 7+ Years old  Completed   DEXA SCAN  Completed   Hepatitis C Screening  Completed   Zoster Vaccines- Shingrix  Completed   HPV VACCINES  Aged Out   Colonoscopy  Discontinued    -See a dentist at least yearly  -Get your eyes checked and then per your eye specialist's recommendations  -Other issues addressed today:   -I have included below further information regarding a healthy whole foods based diet, physical activity guidelines for adults, stress management and opportunities for social connections. I hope you find this information useful.   -----------------------------------------------------------------------------------------------------------------------------------------------------------------------------------------------------------------------------------------------------------  NUTRITION: -eat real food: lots of colorful vegetables (half the plate) and fruits -5-7 servings of vegetables and fruits per day (fresh or steamed  is best), exp. 2 servings of vegetables with lunch and dinner and 2 servings of fruit per day. Berries and greens such as kale and collards are great choices.  -consume on a regular basis: whole grains (make sure first ingredient on label contains the word "whole"), fresh fruits, fish, nuts, seeds, healthy oils (such as olive oil, avocado oil, grape seed oil) -may eat small amounts of dairy and lean meat on occasion, but avoid processed meats such as ham, bacon, lunch meat, etc. -drink water -try to avoid fast food and pre-packaged foods, processed meat -most experts advise limiting sodium to < 2300mg  per day, should limit further is any chronic conditions such as high blood pressure, heart disease, diabetes, etc. The American Heart Association advised that < 1500mg  is is ideal -try to avoid foods that contain any ingredients with names you do not recognize  -try to avoid sugar/sweets (except for the natural sugar that occurs in fresh fruit) -try to avoid sweet drinks -try to avoid white rice, white bread, pasta (unless whole grain), white or yellow potatoes  EXERCISE GUIDELINES FOR ADULTS: -if you wish to increase your physical activity, do so gradually and with the approval of your doctor -STOP and seek medical care immediately if you have any chest pain, chest discomfort or trouble breathing when starting or increasing exercise  -move and stretch your body, legs, feet and arms when sitting for long periods -Physical activity guidelines for optimal health in adults: -least 150 minutes per week of aerobic exercise (can talk, but not sing) once approved by your doctor, 20-30 minutes of sustained activity or two 10 minute episodes of sustained activity every day.  -resistance training at least 2 days per week if approved  by your doctor -balance exercises 3+ days per week:   Stand somewhere where you have something sturdy to hold onto if you lose balance.    1) lift up on toes, start with 5x per  day and work up to 20x   2) stand and lift on leg straight out to the side so that foot is a few inches of the floor, start with 5x each side and work up to 20x each side   3) stand on one foot, start with 5 seconds each side and work up to 20 seconds on each side  If you need ideas or help with getting more active:  -Silver sneakers https://tools.silversneakers.com  -Walk with a Doc: http://www.duncan-williams.com/  -try to include resistance (weight lifting/strength building) and balance exercises twice per week: or the following link for ideas: http://castillo-powell.com/  BuyDucts.dk  STRESS MANAGEMENT: -can try meditating, or just sitting quietly with deep breathing while intentionally relaxing all parts of your body for 5 minutes daily -if you need further help with stress, anxiety or depression please follow up with your primary doctor or contact the wonderful folks at [facility]: [phone]  SOCIAL CONNECTIONS: -options in [place] if you wish to engage in more social and exercise related activities:  -Silver sneakers https://tools.silversneakers.com  -Walk with a Doc: http://www.duncan-williams.com/  -Check out the [facility] Active Adults 50+ section on the [place] [employer] (hiking clubs, book clubs, cards and games, chess, exercise classes, aquatic classes and much more) - see the website for details: https://www.[place].gov/departments/parks-recreation/active-adults50  -YouTube has lots of exercise videos for different ages and abilities as well  -[facility] (a variety of indoor and outdoor inperson activities for adults). [phone]. [address].  -Virtual Online Classes (a variety of topics): see seniorplanet.org or call [phone]  -consider volunteering at a school, hospice center, church, senior center or  elsewhere

## 2023-07-25 NOTE — Progress Notes (Signed)
PATIENT CHECK-IN and HEALTH RISK ASSESSMENT QUESTIONNAIRE:  -completed by phone/video for upcoming Medicare Preventive Visit  Pre-Visit Check-in: 1)Vitals (height, wt, BP, etc) - record in vitals section for visit on day of visit Request home vitals (wt, BP, etc.) and enter into vitals, THEN update Vital Signs SmartPhrase below at the top of the HPI. See below.  2)Review and Update Medications, Allergies PMH, Surgeries, Social history in Epic 3)Hospitalizations in the last year with date/reason? no  4)Review and Update Care Team (patient's specialists) in Epic 5) Complete PHQ9 in Epic  6) Complete Fall Screening in Epic 7)Review all Health Maintenance Due and order under PCP if not done.  8)Medicare Wellness Questionnaire: Answer theses question about your habits: Do you drink alcohol? Occasionally one a month, a glass of wine. If yes, how many drinks do you have a day? Have you ever smoked?no Quit date if applicable? N/a  How many packs a day do/did you smoke? N/a Do you use smokeless tobacco?no Do you use an illicit drugs?no Do you exercises? Do a lot of walking daily for a half an hour daily and gardening Are you sexually active? No Number of partners? Typical breakfast:cottage cheese, just coffee, water, yogurt, oatmeal Typical lunch:salad-cheese, chicken, fish Typical dinner: mostly vegetable, fish, chicken Typical snacks: almond, fruits  Beverages: water, tea  Answer theses question about you: Can you perform most household chores?yes Do you find it hard to follow a conversation in a noisy room?no Do you often ask people to speak up or repeat themselves?no Do you feel that you have a problem with memory?no Do you balance your checkbook and or bank acounts?yes Do you feel safe at home?yes Last dentist visit?do every 6 months, recent visit was last March.  Do you need assistance with any of the following: Please note if so   Driving?no  Feeding yourself?no  Getting from  bed to chair?no  Getting to the toilet?no  Bathing or showering?no  Dressing yourself?no  Managing money?no  Climbing a flight of stairs no  Preparing meals?no  Do you have Advanced Directives in place (Living Will, Healthcare Power or Attorney)? yes   Last eye Exam and location?last week to [facility] in [place].    Do you currently use prescribed or non-prescribed narcotic or opioid pain medications?no  Do you have a history or close family history of breast, ovarian, tubal or peritoneal cancer or a family member with BRCA (breast cancer susceptibility 1 and 2) gene mutations? no  Request home vitals (wt, BP, etc.) and enter into vitals, THEN update Vital Signs SmartPhrase below at the top of the HPI. See below.   Nurse/Assistant Credentials/time stamp: Karpuih M./CMA/3:59   ----------------------------------------------------------------------------------------------------------------------------------------------------------------------------------------------------------------------  Because this visit was a virtual/telehealth visit, some criteria may be missing or patient reported. Any vitals not documented were not able to be obtained and vitals that have been documented are patient reported.    MEDICARE ANNUAL PREVENTIVE VISIT WITH PROVIDER: (Welcome to Medicare, initial annual wellness or annual wellness exam)  Virtual Visit via Video Note  I connected with [place] on 07/25/23 by  and verified that I am speaking with the correct person using two identifiers.  Location patient: home Location provider:work or home office Persons participating in the virtual visit: patient, provider  Concerns and/or follow up today:   See HM section in Epic for other details of completed HM.    ROS: negative for report of fevers, unintentional weight loss, vision changes, vision loss, hearing loss or change,  chest pain, sob, hemoptysis, melena, hematochezia, hematuria,  falls, bleeding or bruising, thoughts of suicide or self harm, memory loss  Patient-completed extensive health risk assessment - reviewed and discussed with the patient: See Health Risk Assessment completed with patient prior to the visit either above or in recent phone note. This was reviewed in detailed with the patient today and appropriate recommendations, orders and referrals were placed as needed per Summary below and patient instructions.   Review of Medical History: -PMH, PSH, Family History and current specialty and care providers reviewed and updated and listed below   Patient Care Team: Philip Aspen, Limmie Patricia, MD as PCP - General (Internal Medicine)   Past Medical History:  Diagnosis Date   Allergic eye reaction    Allergic rhinitis    Asthma    Hair loss    HTN (hypertension)    PMS (premenstrual syndrome)    Rectal tenesmus    Urinary incontinence 2005    Past Surgical History:  Procedure Laterality Date   ABDOMINAL SURGERY  2006   Tummy Tuck   AUGMENTATION MAMMAPLASTY Bilateral 2007   BREAST ENHANCEMENT SURGERY  2006   Saline implants   CATARACT EXTRACTION Left 10/28/2013   LIPOSUCTION  2006   OTHER SURGICAL HISTORY     CB X3   TUBAL LIGATION  1985    Social History   Socioeconomic History   Marital status: Married    Spouse name: Not on file   Number of children: Not on file   Years of education: Not on file   Highest education level: Master's degree (e.g., MA, MS, MEng, MEd, MSW, MBA)  Occupational History   Not on file  Tobacco Use   Smoking status: Never   Smokeless tobacco: Never  Vaping Use   Vaping status: Never Used  Substance and Sexual Activity   Alcohol use: Yes    Alcohol/week: 1.0 standard drink of alcohol    Types: 1 drink(s) per week   Drug use: No   Sexual activity: Not Currently  Other Topics Concern   Not on file  Social History Narrative   ** Merged History Encounter **       Social Determinants of Health    Financial Resource Strain: Low Risk  (08/15/2022)   Overall Financial Resource Strain (CARDIA)    Difficulty of Paying Living Expenses: Not hard at all  Food Insecurity: No Food Insecurity (08/15/2022)   Hunger Vital Sign    Worried About Running Out of Food in the Last Year: Never true    Ran Out of Food in the Last Year: Never true  Transportation Needs: No Transportation Needs (08/15/2022)   PRAPARE - Administrator, Civil Service (Medical): No    Lack of Transportation (Non-Medical): No  Physical Activity: Insufficiently Active (08/15/2022)   Exercise Vital Sign    Days of Exercise per Week: 3 days    Minutes of Exercise per Session: 30 min  Stress: No Stress Concern Present (08/15/2022)   [employer] of Occupational Health - Occupational Stress Questionnaire    Feeling of Stress : Only a little  Social Connections: Moderately Integrated (08/15/2022)   Social Connection and Isolation Panel [NHANES]    Frequency of Communication with Friends and Family: Once a week    Frequency of Social Gatherings with Friends and Family: Once a week    Attends Religious Services: 1 to 4 times per year    Active Member of [employer] or Organizations: Yes  Attends Banker Meetings: 1 to 4 times per year    Marital Status: Married  Catering manager Violence: Not on file    Family History  Problem Relation Age of Onset   Alcohol abuse Other    Diabetes Other    Hypertension Other    Prostate cancer Other    Colon cancer Neg Hx     Current Outpatient Medications on File Prior to Visit  Medication Sig Dispense Refill   albuterol (VENTOLIN HFA) 108 (90 Base) MCG/ACT inhaler INHALE 1-2 PUFFS INTO THE LUNGS EVERY 6 (SIX) HOURS AS NEEDED FOR WHEEZING OR SHORTNESS OF BREATH. 18 each 2   atorvastatin (LIPITOR) 20 MG tablet TAKE 1 TABLET BY MOUTH EVERY DAY 90 tablet 1   Calcium-Vitamin D-Vitamin K (CALCIUM + D + K PO) Take by mouth daily.     cetirizine (ZYRTEC) 10 MG  tablet TAKE 1 TABLET BY MOUTH EVERY DAY AS NEEDED 90 tablet 1   conjugated estrogens (PREMARIN) vaginal cream Place vaginally once a week. 42.5 g 11   Docusate Calcium (STOOL SOFTENER PO) Take by mouth as needed.     fluocinonide cream (LIDEX) 0.05 % APPLY ONE APPLICATION TOPICALLY TWO TIMES DAILY AS NEEDED 60 g 3   fluticasone (FLONASE) 50 MCG/ACT nasal spray Place 2 sprays into both nostrils daily as needed. 32 g 11   Hyprom-Naphaz-Polysorb-Zn Sulf (CLEAR EYES COMPLETE OP) Apply to eye.     MELATONIN ER PO Take by mouth as needed.     timolol (TIMOPTIC) 0.5 % ophthalmic solution 1 drop every morning.     triamterene-hydrochlorothiazide (MAXZIDE) 75-50 MG tablet TAKE HALF TABLET BY MOUTH EVERY MORNING 45 tablet 1   benzonatate (TESSALON) 100 MG capsule Take 2 capsules (200 mg total) by mouth 3 (three) times daily as needed for cough. (Patient not taking: Reported on 07/25/2023) 30 capsule 0   No current facility-administered medications on file prior to visit.    Allergies  Allergen Reactions   Bee Venom Swelling   Penicillins     REACTION: hives       Physical Exam Vitals requested from patient and listed below if patient had equipment and was able to obtain at home for this virtual visit: There were no vitals filed for this visit. Estimated body mass index is 24.46 kg/m as calculated from the following:   Height as of this encounter: 5\' 5"  (1.651 m).   Weight as of this encounter: 147 lb (66.7 kg).  EKG (optional): deferred due to virtual visit  GENERAL: alert, oriented, no acute distress detected, full vision exam deferred due to pandemic and/or virtual encounter  HEENT: atraumatic, conjunttiva clear, no obvious abnormalities on inspection of external nose and ears  NECK: normal movements of the head and neck  LUNGS: on inspection no signs of respiratory distress, breathing rate appears normal, no obvious gross SOB, gasping or wheezing  CV: no obvious cyanosis  MS: moves  all visible extremities without noticeable abnormality  PSYCH/NEURO: pleasant and cooperative, no obvious depression or anxiety, speech and thought processing grossly intact, Cognitive function grossly intact  Flowsheet Row Office Visit from 10/04/2022 in [facility]  PHQ-9 Total Score 0           07/25/2023    3:47 PM 10/04/2022   10:04 AM 08/15/2022    3:25 PM 04/04/2022   11:06 AM 02/10/2021   10:02 AM  Depression screen PHQ 2/9  Decreased Interest 0 0 3 0 0  Down, Depressed, Hopeless 0 0 3 0 0  PHQ - 2 Score 0 0 6 0 0  Altered sleeping  0 2 0 0  Tired, decreased energy  0 3 0 0  Change in appetite  0 2 0 0  Feeling bad or failure about yourself   0 3 0 0  Trouble concentrating  0 3 0 0  Moving slowly or fidgety/restless  0 3 0 0  Suicidal thoughts  0 1 0 0  PHQ-9 Score  0 23 0 0  Difficult doing work/chores  Not difficult at all Very difficult  Not difficult at all       04/04/2022   11:06 AM 08/15/2022    8:47 AM 08/15/2022    3:26 PM 10/04/2022   10:04 AM 07/25/2023    3:46 PM  Fall Risk  Falls in the past year? 0 0 1 0 0  Was there an injury with Fall? 0  0 0 0  Fall Risk Category Calculator 0  2 0 0  Fall Risk Category (Retired) Low  Moderate Low   (RETIRED) Patient Fall Risk Level Low fall risk  Low fall risk Low fall risk   Patient at Risk for Falls Due to No Fall Risks  No Fall Risks No Fall Risks No Fall Risks  Fall risk Follow up Falls evaluation completed  Falls evaluation completed Falls evaluation completed Falls evaluation completed     SUMMARY AND PLAN:  Encounter for Medicare annual wellness exam   Discussed applicable health maintenance/preventive health measures and advised and referred or ordered per patient preferences: -she recently had covid, advised can get updated covid vaccine 3 months after the infection -advised flu shot at office or pharmacy -advised to let us know when gets her vaccines so that we  can update -she still plans to get mammogram yearly as is healthy for her age and plans to call to schedule -discussed bone density - normal in 2022, discussed options and she plans to consider every 3-5 years, doing weight bearing exercise and eating healthy Health Maintenance  Topic Date Due   INFLUENZA VACCINE  05/23/2023   COVID-19 Vaccine (6 - 2023-24 season) 11/25/2023 (Originally 06/23/2023)   Medicare Annual Wellness (AWV)  07/24/2024   DTaP/Tdap/Td (6 - Td or Tdap) 05/17/2029   Pneumonia Vaccine 23+ Years old  Completed   DEXA SCAN  Completed   Hepatitis C Screening  Completed   Zoster Vaccines- Shingrix  Completed   HPV VACCINES  Aged Out   Colonoscopy  Discontinued   Education and counseling on the following was provided based on the above review of health and a plan/checklist for the patient, along with additional information discussed, was provided for the patient in the patient instructions :   -Provided safe balance exercises that can be done at home to improve balance and discussed exercise guidelines for adults with include balance exercises at least 3 days per week.  -Advised and counseled on a healthy lifestyle -Reviewed patient's current diet. Advised and counseled on a whole foods based healthy diet. A summary of a healthy diet was provided in the Patient Instructions.  -reviewed patient's current physical activity level and discussed exercise guidelines for adults. Discussed community resources and ideas for safe exercise at home to assist in meeting exercise guideline recommendations in a safe and healthy way.  -Advise yearly dental visits at minimum and regular eye exams   Follow up: see patient instructions     Patient Instructions  I  really enjoyed getting to talk with you today! I am available on Tuesdays and Thursdays for virtual visits if you have any questions or concerns, or if I can be of any further assistance.   CHECKLIST FROM ANNUAL WELLNESS  VISIT:  -Follow up (please call to schedule if not scheduled after visit):   -yearly for annual wellness visit with primary care office  Here is a list of your preventive care/health maintenance measures and the plan for each if any are due:  PLAN For any measures below that may be due:  -can get your covid updated vaccine 3 months after the covid infection -would recommend getting your flu shot as soon as you can - please let us know when you do so that we can update your record  Health Maintenance  Topic Date Due   INFLUENZA VACCINE  05/23/2023   COVID-19 Vaccine (6 - 2023-24 season) 11/25/2023 (Originally 06/23/2023)   Medicare Annual Wellness (AWV)  07/24/2024   DTaP/Tdap/Td (6 - Td or Tdap) 05/17/2029   Pneumonia Vaccine 48+ Years old  Completed   DEXA SCAN  Completed   Hepatitis C Screening  Completed   Zoster Vaccines- Shingrix  Completed   HPV VACCINES  Aged Out   Colonoscopy  Discontinued    -See a dentist at least yearly  -Get your eyes checked and then per your eye specialist's recommendations  -Other issues addressed today:   -I have included below further information regarding a healthy whole foods based diet, physical activity guidelines for adults, stress management and opportunities for social connections. I hope you find this information useful.   -----------------------------------------------------------------------------------------------------------------------------------------------------------------------------------------------------------------------------------------------------------  NUTRITION: -eat real food: lots of colorful vegetables (half the plate) and fruits -5-7 servings of vegetables and fruits per day (fresh or steamed is best), exp. 2 servings of vegetables with lunch and dinner and 2 servings of fruit per day. Berries and greens such as kale and collards are great choices.  -consume on a regular basis: whole grains (make sure first  ingredient on label contains the word "whole"), fresh fruits, fish, nuts, seeds, healthy oils (such as olive oil, avocado oil, grape seed oil) -may eat small amounts of dairy and lean meat on occasion, but avoid processed meats such as ham, bacon, lunch meat, etc. -drink water -try to avoid fast food and pre-packaged foods, processed meat -most experts advise limiting sodium to < 2300mg  per day, should limit further is any chronic conditions such as high blood pressure, heart disease, diabetes, etc. The American Heart Association advised that < 1500mg  is is ideal -try to avoid foods that contain any ingredients with names you do not recognize  -try to avoid sugar/sweets (except for the natural sugar that occurs in fresh fruit) -try to avoid sweet drinks -try to avoid white rice, white bread, pasta (unless whole grain), white or yellow potatoes  EXERCISE GUIDELINES FOR ADULTS: -if you wish to increase your physical activity, do so gradually and with the approval of your doctor -STOP and seek medical care immediately if you have any chest pain, chest discomfort or trouble breathing when starting or increasing exercise  -move and stretch your body, legs, feet and arms when sitting for long periods -Physical activity guidelines for optimal health in adults: -least 150 minutes per week of aerobic exercise (can talk, but not sing) once approved by your doctor, 20-30 minutes of sustained activity or two 10 minute episodes of sustained activity every day.  -resistance training at least 2 days per week if approved  by your doctor -balance exercises 3+ days per week:   Stand somewhere where you have something sturdy to hold onto if you lose balance.    1) lift up on toes, start with 5x per day and work up to 20x   2) stand and lift on leg straight out to the side so that foot is a few inches of the floor, start with 5x each side and work up to 20x each side   3) stand on one foot, start with 5 seconds each  side and work up to 20 seconds on each side  If you need ideas or help with getting more active:  -Silver sneakers https://tools.silversneakers.com  -Walk with a Doc: http://www.duncan-williams.com/  -try to include resistance (weight lifting/strength building) and balance exercises twice per week: or the following link for ideas: http://castillo-powell.com/  BuyDucts.dk  STRESS MANAGEMENT: -can try meditating, or just sitting quietly with deep breathing while intentionally relaxing all parts of your body for 5 minutes daily -if you need further help with stress, anxiety or depression please follow up with your primary doctor or contact the wonderful folks at [facility]: [phone]  SOCIAL CONNECTIONS: -options in [place] if you wish to engage in more social and exercise related activities:  -Silver sneakers https://tools.silversneakers.com  -Walk with a Doc: http://www.duncan-williams.com/  -Check out the [facility] Active Adults 50+ section on the [place] of [employer] (hiking clubs, book clubs, cards and games, chess, exercise classes, aquatic classes and much more) - see the website for details: https://www.[place].gov/departments/parks-recreation/active-adults50  -YouTube has lots of exercise videos for different ages and abilities as well  -[facility] (a variety of indoor and outdoor inperson activities for adults). 217-077-4591. [address]  -Virtual Online Classes (a variety of topics): see seniorplanet.org or call 719-638-9298  -consider volunteering at a school, hospice center, church, senior center or elsewhere           Terressa Koyanagi, DO

## 2023-08-08 ENCOUNTER — Other Ambulatory Visit: Payer: Self-pay | Admitting: Internal Medicine

## 2023-08-08 DIAGNOSIS — E782 Mixed hyperlipidemia: Secondary | ICD-10-CM

## 2023-08-08 DIAGNOSIS — I10 Essential (primary) hypertension: Secondary | ICD-10-CM

## 2023-09-02 ENCOUNTER — Encounter: Payer: Medicare PPO | Admitting: Internal Medicine

## 2023-10-02 ENCOUNTER — Other Ambulatory Visit: Payer: Self-pay | Admitting: Internal Medicine

## 2023-10-02 DIAGNOSIS — Z0000 Encounter for general adult medical examination without abnormal findings: Secondary | ICD-10-CM

## 2023-10-28 ENCOUNTER — Encounter: Payer: Self-pay | Admitting: Internal Medicine

## 2023-10-28 ENCOUNTER — Ambulatory Visit (INDEPENDENT_AMBULATORY_CARE_PROVIDER_SITE_OTHER): Payer: Medicare PPO | Admitting: Internal Medicine

## 2023-10-28 VITALS — BP 120/74 | HR 70 | Temp 97.9°F | Ht 65.0 in | Wt 154.2 lb

## 2023-10-28 DIAGNOSIS — I10 Essential (primary) hypertension: Secondary | ICD-10-CM | POA: Diagnosis not present

## 2023-10-28 DIAGNOSIS — R7989 Other specified abnormal findings of blood chemistry: Secondary | ICD-10-CM

## 2023-10-28 DIAGNOSIS — Z0000 Encounter for general adult medical examination without abnormal findings: Secondary | ICD-10-CM | POA: Diagnosis not present

## 2023-10-28 DIAGNOSIS — Z780 Asymptomatic menopausal state: Secondary | ICD-10-CM

## 2023-10-28 DIAGNOSIS — R7302 Impaired glucose tolerance (oral): Secondary | ICD-10-CM

## 2023-10-28 DIAGNOSIS — E782 Mixed hyperlipidemia: Secondary | ICD-10-CM | POA: Diagnosis not present

## 2023-10-28 LAB — COMPREHENSIVE METABOLIC PANEL
ALT: 24 U/L (ref 0–35)
AST: 25 U/L (ref 0–37)
Albumin: 4.8 g/dL (ref 3.5–5.2)
Alkaline Phosphatase: 78 U/L (ref 39–117)
BUN: 17 mg/dL (ref 6–23)
CO2: 32 meq/L (ref 19–32)
Calcium: 10 mg/dL (ref 8.4–10.5)
Chloride: 100 meq/L (ref 96–112)
Creatinine, Ser: 0.84 mg/dL (ref 0.40–1.20)
GFR: 66.01 mL/min (ref >60.00)
Glucose, Bld: 92 mg/dL (ref 70–99)
Potassium: 3.8 meq/L (ref 3.5–5.1)
Sodium: 140 meq/L (ref 135–145)
Total Bilirubin: 0.6 mg/dL (ref 0.2–1.2)
Total Protein: 8 g/dL (ref 6.0–8.3)

## 2023-10-28 LAB — CBC WITH DIFFERENTIAL/PLATELET
Basophils Absolute: 0 10*3/uL (ref 0.0–0.1)
Basophils Relative: 0.6 % (ref 0.0–3.0)
Eosinophils Absolute: 0.2 10*3/uL (ref 0.0–0.7)
Eosinophils Relative: 4.6 % (ref 0.0–5.0)
HCT: 43.5 % (ref 36.0–46.0)
Hemoglobin: 14.2 g/dL (ref 12.0–15.0)
Lymphocytes Relative: 36.3 % (ref 12.0–46.0)
Lymphs Abs: 1.4 10*3/uL (ref 0.7–4.0)
MCHC: 32.7 g/dL (ref 30.0–36.0)
MCV: 91.3 fL (ref 78.0–100.0)
Monocytes Absolute: 0.5 10*3/uL (ref 0.1–1.0)
Monocytes Relative: 14.6 % — ABNORMAL HIGH (ref 3.0–12.0)
Neutro Abs: 1.6 10*3/uL (ref 1.4–7.7)
Neutrophils Relative %: 43.9 % (ref 43.0–77.0)
Platelets: 269 10*3/uL (ref 150.0–400.0)
RBC: 4.77 Mil/uL (ref 3.87–5.11)
RDW: 13.7 % (ref 11.5–15.5)
WBC: 3.7 10*3/uL — ABNORMAL LOW (ref 4.0–10.5)

## 2023-10-28 LAB — LIPID PANEL
Cholesterol: 263 mg/dL — ABNORMAL HIGH (ref 0–200)
HDL: 69 mg/dL (ref >39.00)
LDL Cholesterol: 179 mg/dL — ABNORMAL HIGH (ref 0–99)
NonHDL: 194
Total CHOL/HDL Ratio: 4
Triglycerides: 77 mg/dL (ref 0.0–149.0)
VLDL: 15.4 mg/dL (ref 0.0–40.0)

## 2023-10-28 LAB — TSH: TSH: 5.01 u[IU]/mL (ref 0.35–5.50)

## 2023-10-28 LAB — HEMOGLOBIN A1C: Hgb A1c MFr Bld: 6.4 % (ref 4.6–6.5)

## 2023-10-28 NOTE — Progress Notes (Signed)
 Established Patient Office Visit     CC/Reason for Visit: Annual preventive exam  HPI: [place]  CHRISTELLA Yoshida is a 78 y.o. female who is coming in today for the above mentioned reasons. Past Medical History is significant for: Hypertension, hyperlipidemia, impaired glucose tolerance.  Is feeling well without acute concerns or complaints.  Has routine eye and dental care.  All immunizations are up-to-date.  Is due for mammogram and DEXA scan.  She elects to defer further colon and cervical cancer screening due to age.   Past Medical/Surgical History: Past Medical History:  Diagnosis Date   Allergic eye reaction    Allergic rhinitis    Asthma    Hair loss    HTN (hypertension)    PMS (premenstrual syndrome)    Rectal tenesmus    Urinary incontinence 2005    Past Surgical History:  Procedure Laterality Date   ABDOMINAL SURGERY  2006   Tummy Tuck   AUGMENTATION MAMMAPLASTY Bilateral 2007   BREAST ENHANCEMENT SURGERY  2006   Saline implants   CATARACT EXTRACTION Left 10/28/2013   LIPOSUCTION  2006   OTHER SURGICAL HISTORY     CB X3   TUBAL LIGATION  1985    Social History:  reports that she has never smoked. She has never used smokeless tobacco. She reports current alcohol use of about 1.0 standard drink of alcohol per week. She reports that she does not use drugs.  Allergies: Allergies  Allergen Reactions   Bee Venom Swelling   Penicillins     REACTION: hives    Family History:  Family History  Problem Relation Age of Onset   Alcohol abuse Other    Diabetes Other    Hypertension Other    Prostate cancer Other    Colon cancer Neg Hx      Current Outpatient Medications:    albuterol  (VENTOLIN  HFA) 108 (90 Base) MCG/ACT inhaler, INHALE 1-2 PUFFS INTO THE LUNGS EVERY 6 (SIX) HOURS AS NEEDED FOR WHEEZING OR SHORTNESS OF BREATH., Disp: 18 each, Rfl: 2   Calcium -Vitamin D -Vitamin K (CALCIUM  + D + K PO), Take by mouth daily., Disp: , Rfl:    cetirizine  (ZYRTEC ) 10  MG tablet, TAKE 1 TABLET BY MOUTH EVERY DAY AS NEEDED, Disp: 90 tablet, Rfl: 1   conjugated estrogens  (PREMARIN ) vaginal cream, Place vaginally once a week., Disp: 42.5 g, Rfl: 11   Docusate Calcium  (STOOL SOFTENER PO), Take by mouth as needed., Disp: , Rfl:    fluocinonide  cream (LIDEX ) 0.05 %, APPLY ONE APPLICATION TOPICALLY TWO TIMES DAILY AS NEEDED, Disp: 60 g, Rfl: 3   fluticasone  (FLONASE ) 50 MCG/ACT nasal spray, Place 2 sprays into both nostrils daily as needed., Disp: 32 g, Rfl: 11   Hyprom-Naphaz-Polysorb-Zn Sulf (CLEAR EYES COMPLETE OP), Apply to eye., Disp: , Rfl:    MELATONIN ER PO, Take by mouth as needed., Disp: , Rfl:    timolol (TIMOPTIC) 0.5 % ophthalmic solution, 1 drop every morning., Disp: , Rfl:    triamterene -hydrochlorothiazide (MAXZIDE) 75-50 MG tablet, TAKE HALF TABLET BY MOUTH EVERY MORNING, Disp: 45 tablet, Rfl: 0  Review of Systems:  Negative unless indicated in HPI.   Physical Exam: Vitals:   10/28/23 1302  BP: 120/74  Pulse: 70  Temp: 97.9 F (36.6 C)  TempSrc: Oral  SpO2: 99%  Weight: 154 lb 3.2 oz (69.9 kg)  Height: 5' 5 (1.651 m)    Body mass index is 25.66 kg/m.   Physical Exam Vitals reviewed.  Constitutional:      General: She is not in acute distress.    Appearance: Normal appearance. She is not ill-appearing, toxic-appearing or diaphoretic.  HENT:     Head: Normocephalic.     Right Ear: Tympanic membrane, ear canal and external ear normal. There is no impacted cerumen.     Left Ear: Tympanic membrane, ear canal and external ear normal. There is no impacted cerumen.     Nose: Nose normal.     Mouth/Throat:     Mouth: Mucous membranes are moist.     Pharynx: Oropharynx is clear. No oropharyngeal exudate or posterior oropharyngeal erythema.  Eyes:     General: No scleral icterus.       Right eye: No discharge.        Left eye: No discharge.     Conjunctiva/sclera: Conjunctivae normal.     Pupils: Pupils are equal, round, and  reactive to light.  Neck:     Vascular: No carotid bruit.  Cardiovascular:     Rate and Rhythm: Normal rate and regular rhythm.     Pulses: Normal pulses.     Heart sounds: Normal heart sounds.  Pulmonary:     Effort: Pulmonary effort is normal. No respiratory distress.     Breath sounds: Normal breath sounds.  Abdominal:     General: Abdomen is flat. Bowel sounds are normal.     Palpations: Abdomen is soft.  Musculoskeletal:        General: Normal range of motion.     Cervical back: Normal range of motion.  Skin:    General: Skin is warm and dry.  Neurological:     General: No focal deficit present.     Mental Status: She is alert and oriented to person, place, and time. Mental status is at baseline.  Psychiatric:        Mood and Affect: Mood normal.        Behavior: Behavior normal.        Thought Content: Thought content normal.        Judgment: Judgment normal.     Impression and Plan:  Encounter for preventive health examination  Essential hypertension -     CBC with Differential/Platelet; Future -     Comprehensive metabolic panel; Future  Mixed hyperlipidemia -     Lipid panel; Future  Abnormal TSH -     TSH; Future  IGT (impaired glucose tolerance) -     Hemoglobin A1c; Future  Postmenopausal estrogen deficiency -     DG Bone Density; Future   -Recommend routine eye and dental care. -Healthy lifestyle discussed in detail. -Labs to be updated today. -Prostate cancer screening: N/A Health Maintenance  Topic Date Due   Medicare Annual Wellness Visit  07/24/2024   DTaP/Tdap/Td vaccine (6 - Td or Tdap) 05/17/2029   Pneumonia Vaccine  Completed   Flu Shot  Completed   DEXA scan (bone density measurement)  Completed   COVID-19 Vaccine  Completed   Hepatitis C Screening  Completed   Zoster (Shingles) Vaccine  Completed   HPV Vaccine  Aged Out   Colon Cancer Screening  Discontinued     -All immunizations are up-to-date. -She elects to opt out of  further colon and cervical cancer screening. -Mammogram is scheduled for tomorrow. -DEXA scan requested.    Tully Theophilus Andrews, MD [facility] at [facility]

## 2023-10-29 ENCOUNTER — Ambulatory Visit
Admission: RE | Admit: 2023-10-29 | Discharge: 2023-10-29 | Disposition: A | Discharge status: Home / Self Care | Payer: Medicare PPO | Source: Ambulatory Visit | Attending: Internal Medicine | Admitting: Internal Medicine

## 2023-10-29 DIAGNOSIS — Z1231 Encounter for screening mammogram for malignant neoplasm of breast: Secondary | ICD-10-CM | POA: Diagnosis not present

## 2023-10-29 DIAGNOSIS — Z0000 Encounter for general adult medical examination without abnormal findings: Secondary | ICD-10-CM

## 2023-10-30 ENCOUNTER — Other Ambulatory Visit: Payer: Self-pay | Admitting: *Deleted

## 2023-10-30 DIAGNOSIS — R7302 Impaired glucose tolerance (oral): Secondary | ICD-10-CM

## 2023-10-30 DIAGNOSIS — E782 Mixed hyperlipidemia: Secondary | ICD-10-CM

## 2023-10-30 MED ORDER — ATORVASTATIN CALCIUM 40 MG PO TABS: 40.0000 mg | ORAL_TABLET | Freq: Every day | ORAL | 1 refills | Status: AC

## 2023-11-04 ENCOUNTER — Ambulatory Visit
Admission: RE | Admit: 2023-11-04 | Discharge: 2023-11-04 | Disposition: A | Discharge status: Home / Self Care | Payer: Medicare PPO | Source: Ambulatory Visit | Attending: Internal Medicine | Admitting: Internal Medicine

## 2023-11-04 DIAGNOSIS — N958 Other specified menopausal and perimenopausal disorders: Secondary | ICD-10-CM | POA: Diagnosis not present

## 2023-11-04 DIAGNOSIS — Z780 Asymptomatic menopausal state: Secondary | ICD-10-CM

## 2023-11-04 DIAGNOSIS — E2839 Other primary ovarian failure: Secondary | ICD-10-CM | POA: Diagnosis not present

## 2023-12-26 ENCOUNTER — Encounter: Payer: Self-pay | Admitting: Internal Medicine

## 2024-01-09 DIAGNOSIS — H40003 Preglaucoma, unspecified, bilateral: Secondary | ICD-10-CM | POA: Diagnosis not present

## 2024-01-16 DIAGNOSIS — H40003 Preglaucoma, unspecified, bilateral: Secondary | ICD-10-CM | POA: Diagnosis not present

## 2024-01-16 DIAGNOSIS — H43813 Vitreous degeneration, bilateral: Secondary | ICD-10-CM | POA: Diagnosis not present

## 2024-01-16 DIAGNOSIS — Z961 Presence of intraocular lens: Secondary | ICD-10-CM | POA: Diagnosis not present

## 2024-01-28 ENCOUNTER — Other Ambulatory Visit (INDEPENDENT_AMBULATORY_CARE_PROVIDER_SITE_OTHER): Payer: Medicare PPO

## 2024-01-28 ENCOUNTER — Encounter: Payer: Self-pay | Admitting: Internal Medicine

## 2024-01-28 DIAGNOSIS — R7302 Impaired glucose tolerance (oral): Secondary | ICD-10-CM

## 2024-01-28 DIAGNOSIS — E782 Mixed hyperlipidemia: Secondary | ICD-10-CM

## 2024-01-28 LAB — LIPID PANEL
Cholesterol: 143 mg/dL (ref 0–200)
HDL: 58.6 mg/dL (ref >39.00)
LDL Cholesterol: 74 mg/dL (ref 0–99)
NonHDL: 84.22
Total CHOL/HDL Ratio: 2
Triglycerides: 50 mg/dL (ref 0.0–149.0)
VLDL: 10 mg/dL (ref 0.0–40.0)

## 2024-01-28 LAB — HEMOGLOBIN A1C: Hgb A1c MFr Bld: 6.4 % (ref 4.6–6.5)

## 2024-03-12 ENCOUNTER — Encounter: Payer: Self-pay | Admitting: Internal Medicine

## 2024-03-12 ENCOUNTER — Ambulatory Visit: Admitting: Internal Medicine

## 2024-03-12 VITALS — BP 120/80 | HR 80 | Temp 98.1°F | Wt 150.1 lb

## 2024-03-12 DIAGNOSIS — J302 Other seasonal allergic rhinitis: Secondary | ICD-10-CM | POA: Diagnosis not present

## 2024-03-12 DIAGNOSIS — R052 Subacute cough: Secondary | ICD-10-CM

## 2024-03-12 MED ORDER — LEVOCETIRIZINE DIHYDROCHLORIDE 5 MG PO TABS: 5.0000 mg | ORAL_TABLET | Freq: Every evening | ORAL | 1 refills | Status: DC

## 2024-03-12 NOTE — Progress Notes (Signed)
 Established Patient Office Visit     CC/Reason for Visit: Subacute cough  HPI: Wendy Salazar is a 78 y.o. female who is coming in today for the above mentioned reasons. Past Medical History is significant for: Seasonal allergies.  This year has been exceptionally uncomfortable for her.  She has been having a lot of drainage and cough especially at nighttime.  She has been taking cetirizine  for years.  No shortness of breath, no fever, no recent travel or sick contacts.   Past Medical/Surgical History: Past Medical History:  Diagnosis Date   Allergic eye reaction    Allergic rhinitis    Asthma    Hair loss    HTN (hypertension)    PMS (premenstrual syndrome)    Rectal tenesmus    Urinary incontinence 2005    Past Surgical History:  Procedure Laterality Date   ABDOMINAL SURGERY  2006   Tummy Tuck   AUGMENTATION MAMMAPLASTY Bilateral 2007   BREAST ENHANCEMENT SURGERY  2006   Saline implants   CATARACT EXTRACTION Left 10/28/2013   LIPOSUCTION  2006   OTHER SURGICAL HISTORY     CB X3   TUBAL LIGATION  1985    Social History:  reports that she has never smoked. She has never used smokeless tobacco. She reports current alcohol use of about 1.0 standard drink of alcohol per week. She reports that she does not use drugs.  Allergies: Allergies  Allergen Reactions   Bee Venom Swelling   Penicillins     REACTION: hives    Family History:  Family History  Problem Relation Age of Onset   Alcohol abuse Other    Diabetes Other    Hypertension Other    Prostate cancer Other    Colon cancer Neg Hx      Current Outpatient Medications:    albuterol  (VENTOLIN  HFA) 108 (90 Base) MCG/ACT inhaler, INHALE 1-2 PUFFS INTO THE LUNGS EVERY 6 (SIX) HOURS AS NEEDED FOR WHEEZING OR SHORTNESS OF BREATH., Disp: 18 each, Rfl: 2   atorvastatin  (LIPITOR) 40 MG tablet, Take 1 tablet (40 mg total) by mouth daily., Disp: 90 tablet, Rfl: 1   Calcium -Vitamin D -Vitamin K (CALCIUM  + D + K  PO), Take by mouth daily., Disp: , Rfl:    conjugated estrogens  (PREMARIN ) vaginal cream, Place vaginally once a week., Disp: 42.5 g, Rfl: 11   Docusate Calcium  (STOOL SOFTENER PO), Take by mouth as needed., Disp: , Rfl:    fluocinonide  cream (LIDEX ) 0.05 %, APPLY ONE APPLICATION TOPICALLY TWO TIMES DAILY AS NEEDED, Disp: 60 g, Rfl: 3   fluticasone  (FLONASE ) 50 MCG/ACT nasal spray, Place 2 sprays into both nostrils daily as needed., Disp: 32 g, Rfl: 11   Hyprom-Naphaz-Polysorb-Zn Sulf (CLEAR EYES COMPLETE OP), Apply to eye., Disp: , Rfl:    levocetirizine (XYZAL) 5 MG tablet, Take 1 tablet (5 mg total) by mouth every evening., Disp: 90 tablet, Rfl: 1   MELATONIN ER PO, Take by mouth as needed., Disp: , Rfl:    timolol (TIMOPTIC) 0.5 % ophthalmic solution, 1 drop every morning., Disp: , Rfl:    triamterene -hydrochlorothiazide (MAXZIDE) 75-50 MG tablet, TAKE HALF TABLET BY MOUTH EVERY MORNING, Disp: 45 tablet, Rfl: 0  Review of Systems:  Negative unless indicated in HPI.   Physical Exam: Vitals:   03/12/24 1341  BP: 120/80  Pulse: 80  Temp: 98.1 F (36.7 C)  TempSrc: Oral  SpO2: 98%  Weight: 150 lb 1.6 oz (68.1 kg)  Body mass index is 24.98 kg/m.   Physical Exam Vitals reviewed.  Constitutional:      Appearance: Normal appearance.  HENT:     Head: Normocephalic and atraumatic.  Eyes:     Conjunctiva/sclera: Conjunctivae normal.  Cardiovascular:     Rate and Rhythm: Normal rate and regular rhythm.  Pulmonary:     Effort: Pulmonary effort is normal.     Breath sounds: Normal breath sounds.  Skin:    General: Skin is warm and dry.  Neurological:     General: No focal deficit present.     Mental Status: She is alert and oriented to person, place, and time.  Psychiatric:        Mood and Affect: Mood normal.        Behavior: Behavior normal.        Thought Content: Thought content normal.        Judgment: Judgment normal.      Impression and Plan:  Seasonal  allergies -     Levocetirizine Dihydrochloride; Take 1 tablet (5 mg total) by mouth every evening.  Dispense: 90 tablet; Refill: 1  Subacute cough   - Suspect her coughing is from postnasal drip and allergies.  Lung sounds are clear.  Will switch out cetirizine  for levocetirizine and she will use over-the-counter cough syrup as needed.  Time spent:22 minutes reviewing chart, interviewing and examining patient and formulating plan of care.     Marguerita Shih, MD [facility] at [facility]

## 2024-03-13 ENCOUNTER — Other Ambulatory Visit: Payer: Self-pay | Admitting: Internal Medicine

## 2024-03-13 DIAGNOSIS — I10 Essential (primary) hypertension: Secondary | ICD-10-CM

## 2024-05-12 ENCOUNTER — Encounter: Payer: Self-pay | Admitting: Internal Medicine

## 2024-05-12 ENCOUNTER — Ambulatory Visit: Admitting: Internal Medicine

## 2024-05-12 VITALS — BP 120/70 | HR 65 | Temp 97.8°F | Wt 151.0 lb

## 2024-05-12 DIAGNOSIS — E782 Mixed hyperlipidemia: Secondary | ICD-10-CM

## 2024-05-12 DIAGNOSIS — R7302 Impaired glucose tolerance (oral): Secondary | ICD-10-CM | POA: Diagnosis not present

## 2024-05-12 DIAGNOSIS — I10 Essential (primary) hypertension: Secondary | ICD-10-CM | POA: Diagnosis not present

## 2024-05-12 LAB — POCT GLYCOSYLATED HEMOGLOBIN (HGB A1C): Hemoglobin A1C: 6.2 % — AB (ref 4.0–5.6)

## 2024-05-12 NOTE — Progress Notes (Signed)
 Established Patient Office Visit     CC/Reason for Visit: Follow-up chronic conditions  HPI: Wendy  Wendy Salazar is a 78 y.o. female who is coming in today for the above mentioned reasons. Past Medical History is significant for: Hypertension, hyperlipidemia, impaired glucose tolerance.  She has been feeling well and has no acute concerns or complaints.   Past Medical/Surgical History: Past Medical History:  Diagnosis Date   Allergic eye reaction    Allergic rhinitis    Asthma    Hair loss    HTN (hypertension)    PMS (premenstrual syndrome)    Rectal tenesmus    Urinary incontinence 2005    Past Surgical History:  Procedure Laterality Date   ABDOMINAL SURGERY  2006   Tummy Tuck   AUGMENTATION MAMMAPLASTY Bilateral 2007   BREAST ENHANCEMENT SURGERY  2006   Saline implants   CATARACT EXTRACTION Left 10/28/2013   LIPOSUCTION  2006   OTHER SURGICAL HISTORY     CB X3   TUBAL LIGATION  1985    Social History:  reports that she has never smoked. She has never used smokeless tobacco. She reports current alcohol use of about 1.0 standard drink of alcohol per week. She reports that she does not use drugs.  Allergies: Allergies  Allergen Reactions   Bee Venom Swelling   Penicillins     REACTION: hives    Family History:  Family History  Problem Relation Age of Onset   Alcohol abuse Other    Diabetes Other    Hypertension Other    Prostate cancer Other    Colon cancer Neg Hx      Current Outpatient Medications:    albuterol  (VENTOLIN  HFA) 108 (90 Base) MCG/ACT inhaler, INHALE 1-2 PUFFS INTO THE LUNGS EVERY 6 (SIX) HOURS AS NEEDED FOR WHEEZING OR SHORTNESS OF BREATH., Disp: 18 each, Rfl: 2   atorvastatin  (LIPITOR) 40 MG tablet, Take 1 tablet (40 mg total) by mouth daily., Disp: 90 tablet, Rfl: 1   Calcium -Vitamin D -Vitamin K (CALCIUM  + D + K PO), Take by mouth daily., Disp: , Rfl:    conjugated estrogens  (PREMARIN ) vaginal cream, Place vaginally once a week.,  Disp: 42.5 g, Rfl: 11   Docusate Calcium  (STOOL SOFTENER PO), Take by mouth as needed., Disp: , Rfl:    fluocinonide  cream (LIDEX ) 0.05 %, APPLY ONE APPLICATION TOPICALLY TWO TIMES DAILY AS NEEDED, Disp: 60 g, Rfl: 3   fluticasone  (FLONASE ) 50 MCG/ACT nasal spray, Place 2 sprays into both nostrils daily as needed., Disp: 32 g, Rfl: 11   Hyprom-Naphaz-Polysorb-Zn Sulf (CLEAR EYES COMPLETE OP), Apply to eye., Disp: , Rfl:    levocetirizine (XYZAL ) 5 MG tablet, Take 1 tablet (5 mg total) by mouth every evening., Disp: 90 tablet, Rfl: 1   MELATONIN ER PO, Take by mouth as needed., Disp: , Rfl:    timolol (TIMOPTIC) 0.5 % ophthalmic solution, 1 drop every morning., Disp: , Rfl:    triamterene -hydrochlorothiazide (MAXZIDE) 75-50 MG tablet, TAKE HALF TABLET BY MOUTH EVERY MORNING, Disp: 45 tablet, Rfl: 0  Review of Systems:  Negative unless indicated in HPI.   Physical Exam: Vitals:   05/12/24 0901  BP: 120/70  Pulse: 65  Temp: 97.8 F (36.6 C)  TempSrc: Oral  SpO2: 95%  Weight: 151 lb (68.5 kg)    Body mass index is 25.13 kg/m.   Physical Exam Vitals reviewed.  Constitutional:      Appearance: Normal appearance.  HENT:     Head:  Normocephalic and atraumatic.  Eyes:     Conjunctiva/sclera: Conjunctivae normal.  Cardiovascular:     Rate and Rhythm: Normal rate and regular rhythm.  Pulmonary:     Effort: Pulmonary effort is normal.     Breath sounds: Normal breath sounds.  Skin:    General: Skin is warm and dry.  Neurological:     General: No focal deficit present.     Mental Status: She is alert and oriented to person, place, and time.  Psychiatric:        Mood and Affect: Mood normal.        Behavior: Behavior normal.        Thought Content: Thought content normal.        Judgment: Judgment normal.      Impression and Plan:  IGT (impaired glucose tolerance) -     POCT glycosylated hemoglobin (Hb A1C)  Mixed hyperlipidemia  Essential hypertension   - Blood  pressure is well-controlled on current. - Last cholesterol panel showed an LDL of 74 on atorvastatin  40 mg. - A1c remains stable in the prediabetic range at 6.2.  We have continued to discuss lifestyle changes.  Time spent:31 minutes reviewing chart, interviewing and examining patient and formulating plan of care.     Tully Theophilus Andrews, MD [facility] at [facility]

## 2024-07-09 ENCOUNTER — Other Ambulatory Visit: Payer: Self-pay | Admitting: Internal Medicine

## 2024-07-09 DIAGNOSIS — I10 Essential (primary) hypertension: Secondary | ICD-10-CM

## 2024-08-11 DIAGNOSIS — H40003 Preglaucoma, unspecified, bilateral: Secondary | ICD-10-CM | POA: Diagnosis not present

## 2024-08-11 DIAGNOSIS — Z961 Presence of intraocular lens: Secondary | ICD-10-CM | POA: Diagnosis not present

## 2024-08-11 DIAGNOSIS — H43813 Vitreous degeneration, bilateral: Secondary | ICD-10-CM | POA: Diagnosis not present

## 2024-08-15 ENCOUNTER — Other Ambulatory Visit: Payer: Self-pay | Admitting: Internal Medicine

## 2024-08-15 DIAGNOSIS — E782 Mixed hyperlipidemia: Secondary | ICD-10-CM

## 2024-08-17 MED ORDER — ATORVASTATIN CALCIUM 20 MG PO TABS: 20.0000 mg | ORAL_TABLET | Freq: Every day | ORAL | 1 refills | Status: DC

## 2024-08-17 NOTE — Addendum Note (Signed)
 Addended by: KATHRYNE MILLMAN B on: 08/17/2024 10:05 AM   Modules accepted: Orders

## 2024-08-18 ENCOUNTER — Ambulatory Visit: Admitting: Family Medicine

## 2024-08-18 DIAGNOSIS — Z0000 Encounter for general adult medical examination without abnormal findings: Secondary | ICD-10-CM | POA: Diagnosis not present

## 2024-08-18 NOTE — Progress Notes (Signed)
 PATIENT CHECK-IN and HEALTH RISK ASSESSMENT QUESTIONNAIRE:  -completed by phone/video for upcoming Medicare Preventive Visit  Pre-Visit Check-in: 1)Vitals (height, wt, BP, etc) - record in vitals section for visit on day of visit Request home vitals (wt, BP, etc.) and enter into vitals, THEN update Vital Signs SmartPhrase below at the top of the HPI. See below.  2)Review and Update Medications, Allergies PMH, Surgeries, Social history in Epic 3)Hospitalizations in the last year with date/reason? n  4)Review and Update Care Team (patient's specialists) in Epic 5) Complete PHQ9 in Epic  6) Complete Fall Screening in Epic 7)Review all Health Maintenance Due and order if not done.  Medicare Wellness Patient Questionnaire:  Answer theses question about your habits: How often do you have a drink containing alcohol? Once a month or holidays How many drinks containing alcohol do you have on a typical day when you are drinking?1 glass wine How often do you have six or more drinks on one occasion?never Have you ever smoked?n Quit date if applicable? na  How many packs a day do/did you smoke? na Do you use smokeless tobacco?n Do you use an illicit drugs?n On average, how many days per week do you engage in moderate to strenuous exercise (like a brisk walk)?usually about 2 days per week On average, how many minutes do you engage in exercise at this level? 15-20 minutes, some gardening in the summer Not sexually active Typical breakfast: yogurt, fruit, coffee Typical lunch: usually leftovers or nuts and fruit or cottage cheese Typical dinner: fish or chicken, veggies or salad Typical snacks:not much - fruit and nuts  Beverages: water  Answer theses question about your everyday activities: Can you perform most household chores?y Are you deaf or have significant trouble hearing?n Do you feel that you have a problem with memory?n Do you feel safe at home?yes Last dentist visit?get regular  dental check ups 8. Do you have any difficulty performing your everyday activities?n Are you having any difficulty walking, taking medications on your own, and or difficulty managing daily home needs?n Do you have difficulty walking or climbing stairs?n Do you have difficulty dressing or bathing?n Do you have difficulty doing errands alone such as visiting a doctor's office or shopping?n Do you currently have any difficulty preparing food and eating?n Do you currently have any difficulty using the toilet?n Do you have any difficulty managing your finances?n Do you have any difficulties with housekeeping of managing your housekeeping?n but has housekeeper   Do you have Advanced Directives in place (Living Will, Healthcare Power or Attorney)? y   Last eye Exam and location? [facility] in [place]   Do you currently use prescribed or non-prescribed narcotic or opioid pain medications?n  Do you have a history or close family history of breast, ovarian, tubal or peritoneal cancer or a family member with BRCA (breast cancer susceptibility 1 and 2) gene mutations?see pmh/fh    ----------------------------------------------------------------------------------------------------------------------------------------------------------------------------------------------------------------------  Because this visit was a virtual/telehealth visit, some criteria may be missing or patient reported. Any vitals not documented were not able to be obtained and vitals that have been documented are patient reported.    MEDICARE ANNUAL PREVENTIVE VISIT WITH PROVIDER: (Welcome to Medicare, initial annual wellness or annual wellness exam)  Virtual Visit via Phone Note  I connected with Wendy Salazar  Wendy Salazar on 08/18/24 by phone and verified that I am speaking with the correct person using two identifiers. She prefers a phone visit.   Location patient: home Location provider:work or home office Persons  participating in the virtual visit: patient, provider  Concerns and/or follow up today: Not really any new concerns. Sometimes stool and urine incontinence for several months. No pain. No fevers, malaise, discomfort, wt loss, weakness or numbness in the legs. She says she plans to see Dr. Theophilus about this.    See HM section in Epic for other details of completed HM.    ROS: negative for report of fevers, unintentional weight loss, vision changes, vision loss, hearing loss or change, chest pain, sob, hemoptysis, melena, hematochezia, hematuria, falls, bleeding or bruising, thoughts of suicide or self harm, memory loss  Patient-completed extensive health risk assessment - reviewed and discussed with the patient: See Health Risk Assessment completed with patient prior to the visit either above or in recent phone note. This was reviewed in detailed with the patient today and appropriate recommendations, orders and referrals were placed as needed per Summary below and patient instructions.   Review of Medical History: -PMH, PSH, Family History and current specialty and care providers reviewed and updated and listed below   Patient Care Team: Theophilus Andrews, Tully GRADE, MD as PCP - General (Internal Medicine)   Past Medical History:  Diagnosis Date   Allergic eye reaction    Allergic rhinitis    Asthma    Hair loss    HTN (hypertension)    PMS (premenstrual syndrome)    Rectal tenesmus    Urinary incontinence 2005    Past Surgical History:  Procedure Laterality Date   ABDOMINAL SURGERY  2006   Tummy Tuck   AUGMENTATION MAMMAPLASTY Bilateral 2007   BREAST ENHANCEMENT SURGERY  2006   Saline implants   CATARACT EXTRACTION Left 10/28/2013   LIPOSUCTION  2006   OTHER SURGICAL HISTORY     CB X3   TUBAL LIGATION  1985    Social History   Socioeconomic History   Marital status: Married    Spouse name: Not on file   Number of children: Not on file   Years of education: Not on  file   Highest education level: Master's degree (e.g., MA, MS, MEng, MEd, MSW, MBA)  Occupational History   Not on file  Tobacco Use   Smoking status: Never   Smokeless tobacco: Never  Vaping Use   Vaping status: Never Used  Substance and Sexual Activity   Alcohol use: Yes    Alcohol/week: 1.0 standard drink of alcohol    Types: 1 drink(s) per week   Drug use: No   Sexual activity: Not Currently  Other Topics Concern   Not on file  Social History Narrative   ** Merged History Encounter **       Social Drivers of Health   Financial Resource Strain: Low Risk  (08/15/2022)   Overall Financial Resource Strain (CARDIA)    Difficulty of Paying Living Expenses: Not hard at all  Food Insecurity: No Food Insecurity (08/15/2022)   Hunger Vital Sign    Worried About Running Out of Food in the Last Year: Never true    Ran Out of Food in the Last Year: Never true  Transportation Needs: No Transportation Needs (08/15/2022)   PRAPARE - Administrator, Civil Service (Medical): No    Lack of Transportation (Non-Medical): No  Physical Activity: Insufficiently Active (08/15/2022)   Exercise Vital Sign    Days of Exercise per Week: 3 days    Minutes of Exercise per Session: 30 min  Stress: No Stress Concern Present (08/15/2022)   [employer]  of Occupational Health - Occupational Stress Questionnaire    Feeling of Stress : Only a little  Social Connections: Moderately Integrated (08/15/2022)   Social Connection and Isolation Panel    Frequency of Communication with Friends and Family: Once a week    Frequency of Social Gatherings with Friends and Family: Once a week    Attends Religious Services: 1 to 4 times per year    Active Member of [employer] or Organizations: Yes    Attends Banker Meetings: 1 to 4 times per year    Marital Status: Married  Catering Manager Violence: Not on file    Family History  Problem Relation Age of Onset   Alcohol abuse Other     Diabetes Other    Hypertension Other    Prostate cancer Other    Colon cancer Neg Hx     Current Outpatient Medications on File Prior to Visit  Medication Sig Dispense Refill   albuterol  (VENTOLIN  HFA) 108 (90 Base) MCG/ACT inhaler INHALE 1-2 PUFFS INTO THE LUNGS EVERY 6 (SIX) HOURS AS NEEDED FOR WHEEZING OR SHORTNESS OF BREATH. 18 each 2   atorvastatin  (LIPITOR) 20 MG tablet Take 1 tablet (20 mg total) by mouth daily. 90 tablet 1   atorvastatin  (LIPITOR) 40 MG tablet Take 1 tablet (40 mg total) by mouth daily. 90 tablet 1   Calcium -Vitamin D -Vitamin K (CALCIUM  + D + K PO) Take by mouth daily.     conjugated estrogens  (PREMARIN ) vaginal cream Place vaginally once a week. 42.5 g 11   Docusate Calcium  (STOOL SOFTENER PO) Take by mouth as needed.     fluocinonide  cream (LIDEX ) 0.05 % APPLY ONE APPLICATION TOPICALLY TWO TIMES DAILY AS NEEDED 60 g 3   fluticasone  (FLONASE ) 50 MCG/ACT nasal spray Place 2 sprays into both nostrils daily as needed. 32 g 11   Hyprom-Naphaz-Polysorb-Zn Sulf (CLEAR EYES COMPLETE OP) Apply to eye.     levocetirizine (XYZAL ) 5 MG tablet Take 1 tablet (5 mg total) by mouth every evening. 90 tablet 1   MELATONIN ER PO Take by mouth as needed.     timolol (TIMOPTIC) 0.5 % ophthalmic solution 1 drop every morning.     triamterene -hydrochlorothiazide (MAXZIDE) 75-50 MG tablet TAKE HALF TABLET BY MOUTH EVERY MORNING 45 tablet 1   No current facility-administered medications on file prior to visit.    Allergies  Allergen Reactions   Bee Venom Swelling   Penicillins     REACTION: hives       Physical Exam Vitals requested from patient and listed below if patient had equipment and was able to obtain at home for this virtual visit: There were no vitals filed for this visit. Estimated body mass index is 25.13 kg/m as calculated from the following:   Height as of 10/28/23: 5' 5 (1.651 m).   Weight as of 05/12/24: 151 lb (68.5 kg).  EKG (optional): deferred due to  virtual visit  GENERAL: alert, oriented, no acute distress detected, full vision exam deferred due to pandemic and/or virtual encounter  PSYCH/NEURO: pleasant and cooperative, no obvious depression or anxiety, speech and thought processing grossly intact, Cognitive function grossly intact  Flowsheet Row Office Visit from 10/28/2023 in [facility]  PHQ-9 Total Score 0        08/18/2024    3:45 PM 10/28/2023    1:27 PM 07/25/2023    3:47 PM 10/04/2022   10:04 AM 08/15/2022    3:25 PM  Depression screen  PHQ 2/9  Decreased Interest 0 0 0 0 3  Down, Depressed, Hopeless 0 0 0 0 3  PHQ - 2 Score 0 0 0 0 6  Altered sleeping  0  0 2  Tired, decreased energy  0  0 3  Change in appetite  0  0 2  Feeling bad or failure about yourself   0  0 3  Trouble concentrating  0  0 3  Moving slowly or fidgety/restless  0  0 3  Suicidal thoughts  0  0 1  PHQ-9 Score  0  0 23  Difficult doing work/chores  Not difficult at all  Not difficult at all Very difficult       08/15/2022    3:26 PM 10/04/2022   10:04 AM 07/25/2023    3:46 PM 10/28/2023    1:27 PM 08/18/2024    3:44 PM  Fall Risk  Falls in the past year? 1 0 0 0 0  Was there an injury with Fall? 0 0 0 0 0  Fall Risk Category Calculator 2 0 0 0 0  Fall Risk Category (Retired) Moderate  Low      (RETIRED) Patient Fall Risk Level Low fall risk  Low fall risk      Patient at Risk for Falls Due to No Fall Risks No Fall Risks No Fall Risks    Fall risk Follow up Falls evaluation completed  Falls evaluation completed  Falls evaluation completed Falls evaluation completed Education provided;Falls evaluation completed     Data saved with a previous flowsheet row definition     SUMMARY AND PLAN:  Encounter for Medicare annual wellness exam  Discussed applicable health maintenance/preventive health measures and advised and referred or ordered per patient preferences:  Health Maintenance  Topic Date Due    COVID-19 Vaccine (8 - 2025-26 season) 02/13/2025   Medicare Annual Wellness (AWV)  08/18/2025   DTaP/Tdap/Td (6 - Td or Tdap) 05/17/2029   Pneumococcal Vaccine: 50+ Years  Completed   Influenza Vaccine  Completed   DEXA SCAN  Completed   Zoster Vaccines- Shingrix  Completed   Meningococcal B Vaccine  Aged Out   Mammogram  Discontinued   Colonoscopy  Discontinued   Hepatitis C Screening  Discontinued      Education and counseling on the following was provided based on the above review of health and a plan/checklist for the patient, along with additional information discussed, was provided for the patient in the patient instructions :  -advise prompt inperson evaluation with Dr. Theophilus for the fecal and urinary incontinence - message sent to scheduling staff and told patient to call if not contacted by tomorrow.  -Advised and counseled on a healthy lifestyle - including the importance of a healthy diet, regular physical activity, social connections and stress management. -Reviewed patient's current diet. Advised and counseled on a whole foods based healthy diet. A summary of a healthy diet was provided in the Patient Instructions.  -reviewed patient's current physical activity level and discussed exercise guidelines for adults. Discussed community resources and ideas for safe exercise at home to assist in meeting exercise guideline recommendations in a safe and healthy way.  -Advise yearly dental visits at minimum and regular eye exams  Follow up: see patient instructions     Patient Instructions  I really enjoyed getting to talk with you today! I am available on Tuesdays and Thursdays for virtual visits if you have any questions or concerns, or if I can be  of any further assistance.   CHECKLIST FROM ANNUAL WELLNESS VISIT:  -Follow up (please call to schedule if not scheduled after visit):   -inperson office visit with Dr. Theophilus ASAP for fecal and urine incontinence   -yearly  for annual wellness visit with primary care office  Here is a list of your preventive care/health maintenance measures and the plan for each if any are due:  PLAN For any measures below that may be due:    Health Maintenance  Topic Date Due   COVID-19 Vaccine (8 - 2025-26 season) 02/13/2025   Medicare Annual Wellness (AWV)  08/18/2025   DTaP/Tdap/Td (6 - Td or Tdap) 05/17/2029   Pneumococcal Vaccine: 50+ Years  Completed   Influenza Vaccine  Completed   DEXA SCAN  Completed   Zoster Vaccines- Shingrix  Completed   Meningococcal B Vaccine  Aged Out   Mammogram  Discontinued   Colonoscopy  Discontinued   Hepatitis C Screening  Discontinued    -See a dentist at least yearly  -Get your eyes checked and then per your eye specialist's recommendations  -Other issues addressed today:   -I have included below further information regarding a healthy whole foods based diet, physical activity guidelines for adults, stress management and opportunities for social connections. I hope you find this information useful.   -----------------------------------------------------------------------------------------------------------------------------------------------------------------------------------------------------------------------------------------------------------    NUTRITION: -eat real food: lots of colorful vegetables (half the plate) and fruits -5-7 servings of vegetables and fruits per day (fresh or steamed is best), exp. 2 servings of vegetables with lunch and dinner and 2 servings of fruit per day. Berries and greens such as kale and collards are great choices.  -consume on a regular basis:  fresh fruits, fresh veggies, fish, nuts, seeds, healthy oils (such as olive oil, avocado oil), whole grains (make sure for bread/pasta/crackers/etc., that the first ingredient on label contains the word whole), legumes. -can eat small amounts of dairy and lean meat (no larger than the palm of  your hand), but avoid processed meats such as ham, bacon, lunch meat, etc. -drink water -try to avoid fast food and pre-packaged foods, processed meat, ultra processed foods/beverages (donuts, candy, etc.) -most experts advise limiting sodium to < 2300mg  per day, should limit further is any chronic conditions such as high blood pressure, heart disease, diabetes, etc. The American Heart Association advised that < 1500mg  is is ideal -try to avoid foods/beverages that contain any ingredients with names you do not recognize  -try to avoid foods/beverages  with added sugar or sweeteners/sweets  -try to avoid sweet drinks (including diet drinks): soda, juice, Gatorade, sweet tea, power drinks, diet drinks -try to avoid white rice, white bread, pasta (unless whole grain)  EXERCISE GUIDELINES FOR ADULTS: -if you wish to increase your physical activity, do so gradually and with the approval of your doctor -STOP and seek medical care immediately if you have any chest pain, chest discomfort or trouble breathing when starting or increasing exercise  -move and stretch your body, legs, feet and arms when sitting for long periods -Physical activity guidelines for optimal health in adults: -get at least 150 minutes per week of moderate exercise (can talk, but not sing); this is about 20-30 minutes of sustained activity 5-7 days per week or two 10-15 minute episodes of sustained activity 5-7 days per week -do some muscle building/resistance training/strength training at least 2 days per week  -balance exercises 3+ days per week:   Stand somewhere where you have something sturdy to hold onto  if you lose balance    1) lift up on toes, then back down, start with 5x per day and work up to 20x   2) stand and lift one leg straight out to the side so that foot is a few inches of the floor, start with 5x each side and work up to 20x each side   3) stand on one foot, start with 5 seconds each side and work up to 20  seconds on each side  If you need ideas or help with getting more active:  -Silver sneakers https://tools.silversneakers.com  -Walk with a Doc: Http://www.duncan-williams.com/  -try to include resistance (weight lifting/strength building) and balance exercises twice per week: or the following link for ideas: http://castillo-powell.com/  buyducts.dk  STRESS MANAGEMENT: -can try meditating, or just sitting quietly with deep breathing while intentionally relaxing all parts of your body for 5 minutes daily -if you need further help with stress, anxiety or depression please follow up with your primary doctor or contact the wonderful folks at [facility]: [phone]  SOCIAL CONNECTIONS: -options in [place] if you wish to engage in more social and exercise related activities:  -Silver sneakers https://tools.silversneakers.com  -Walk with a Doc: Http://www.duncan-williams.com/  -Check out the [facility] Active Adults 50+ section on the [place] [employer] companies (hiking clubs, book clubs, cards and games, chess, exercise classes, aquatic classes and much more) - see the website for details: https://www.[place].gov/departments/parks-recreation/active-adults50  -YouTube has lots of exercise videos for different ages and abilities as well  -[facility] (a variety of indoor and outdoor inperson activities for adults). [phone]. [address]  -Virtual Online Classes (a variety of topics): see seniorplanet.org or call [phone]  -consider volunteering at a school, hospice center, church, senior center or elsewhere            Wendy Salazar Cramp, DO

## 2024-08-18 NOTE — Patient Instructions (Addendum)
 I really enjoyed getting to talk with you today! I am available on Tuesdays and Thursdays for virtual visits if you have any questions or concerns, or if I can be of any further assistance.   CHECKLIST FROM ANNUAL WELLNESS VISIT:  -Follow up (please call to schedule if not scheduled after visit):   -inperson office visit with Dr. Theophilus ASAP for fecal and urine incontinence   -yearly for annual wellness visit with primary care office  Here is a list of your preventive care/health maintenance measures and the plan for each if any are due:  PLAN For any measures below that may be due:    Health Maintenance  Topic Date Due   COVID-19 Vaccine (8 - 2025-26 season) 02/13/2025   Medicare Annual Wellness (AWV)  08/18/2025   DTaP/Tdap/Td (6 - Td or Tdap) 05/17/2029   Pneumococcal Vaccine: 50+ Years  Completed   Influenza Vaccine  Completed   DEXA SCAN  Completed   Zoster Vaccines- Shingrix  Completed   Meningococcal B Vaccine  Aged Out   Mammogram  Discontinued   Colonoscopy  Discontinued   Hepatitis C Screening  Discontinued    -See a dentist at least yearly  -Get your eyes checked and then per your eye specialist's recommendations  -Other issues addressed today:   -I have included below further information regarding a healthy whole foods based diet, physical activity guidelines for adults, stress management and opportunities for social connections. I hope you find this information useful.   -----------------------------------------------------------------------------------------------------------------------------------------------------------------------------------------------------------------------------------------------------------    NUTRITION: -eat real food: lots of colorful vegetables (half the plate) and fruits -5-7 servings of vegetables and fruits per day (fresh or steamed is best), exp. 2 servings of vegetables with lunch and dinner and 2 servings of fruit per  day. Berries and greens such as kale and collards are great choices.  -consume on a regular basis:  fresh fruits, fresh veggies, fish, nuts, seeds, healthy oils (such as olive oil, avocado oil), whole grains (make sure for bread/pasta/crackers/etc., that the first ingredient on label contains the word whole), legumes. -can eat small amounts of dairy and lean meat (no larger than the palm of your hand), but avoid processed meats such as ham, bacon, lunch meat, etc. -drink water -try to avoid fast food and pre-packaged foods, processed meat, ultra processed foods/beverages (donuts, candy, etc.) -most experts advise limiting sodium to < 2300mg  per day, should limit further is any chronic conditions such as high blood pressure, heart disease, diabetes, etc. The American Heart Association advised that < 1500mg  is is ideal -try to avoid foods/beverages that contain any ingredients with names you do not recognize  -try to avoid foods/beverages  with added sugar or sweeteners/sweets  -try to avoid sweet drinks (including diet drinks): soda, juice, Gatorade, sweet tea, power drinks, diet drinks -try to avoid white rice, white bread, pasta (unless whole grain)  EXERCISE GUIDELINES FOR ADULTS: -if you wish to increase your physical activity, do so gradually and with the approval of your doctor -STOP and seek medical care immediately if you have any chest pain, chest discomfort or trouble breathing when starting or increasing exercise  -move and stretch your body, legs, feet and arms when sitting for long periods -Physical activity guidelines for optimal health in adults: -get at least 150 minutes per week of moderate exercise (can talk, but not sing); this is about 20-30 minutes of sustained activity 5-7 days per week or two 10-15 minute episodes of sustained activity 5-7 days per week -do  some muscle building/resistance training/strength training at least 2 days per week  -balance exercises 3+ days per  week:   Stand somewhere where you have something sturdy to hold onto if you lose balance    1) lift up on toes, then back down, start with 5x per day and work up to 20x   2) stand and lift one leg straight out to the side so that foot is a few inches of the floor, start with 5x each side and work up to 20x each side   3) stand on one foot, start with 5 seconds each side and work up to 20 seconds on each side  If you need ideas or help with getting more active:  -Silver sneakers https://tools.silversneakers.com  -Walk with a Doc: Http://www.duncan-williams.com/  -try to include resistance (weight lifting/strength building) and balance exercises twice per week: or the following link for ideas: http://castillo-powell.com/  buyducts.dk  STRESS MANAGEMENT: -can try meditating, or just sitting quietly with deep breathing while intentionally relaxing all parts of your body for 5 minutes daily -if you need further help with stress, anxiety or depression please follow up with your primary doctor or contact the wonderful folks at [facility]: [phone]  SOCIAL CONNECTIONS: -options in [place] if you wish to engage in more social and exercise related activities:  -Silver sneakers https://tools.silversneakers.com  -Walk with a Doc: Http://www.duncan-williams.com/  -Check out the [facility] Active Adults 50+ section on the [place] of [employer] companies (hiking clubs, book clubs, cards and games, chess, exercise classes, aquatic classes and much more) - see the website for details: https://www.[place].gov/departments/parks-recreation/active-adults50  -YouTube has lots of exercise videos for different ages and abilities as well  -[facility] (a variety of indoor and outdoor inperson activities for adults). [phone]. [address]  -Virtual Online Classes (a variety of  topics): see seniorplanet.org or call 2088063649  -consider volunteering at a school, hospice center, church, senior center or elsewhere

## 2024-08-24 ENCOUNTER — Ambulatory Visit: Admitting: Internal Medicine

## 2024-08-24 ENCOUNTER — Encounter: Payer: Self-pay | Admitting: Internal Medicine

## 2024-08-24 VITALS — BP 128/78 | HR 69 | Temp 97.9°F | Ht 65.0 in | Wt 150.9 lb

## 2024-08-24 DIAGNOSIS — R151 Fecal smearing: Secondary | ICD-10-CM

## 2024-08-24 NOTE — Progress Notes (Signed)
 Established Patient Office Visit     CC/Reason for Visit: Fecal incontinence  HPI: Farrie  CHRISTELLA Vallin is a 78 y.o. female who is coming in today for the above mentioned reasons.  For the past month or so has been experiencing some fecal smearing after bowel movements.  This started after she stopped taking her Colace that she has taken chronically for constipation.  She states she has a bowel movement about every other day.   Past Medical/Surgical History: Past Medical History:  Diagnosis Date   Allergic eye reaction    Allergic rhinitis    Asthma    Hair loss    HTN (hypertension)    PMS (premenstrual syndrome)    Rectal tenesmus    Urinary incontinence 2005    Past Surgical History:  Procedure Laterality Date   ABDOMINAL SURGERY  2006   Tummy Tuck   AUGMENTATION MAMMAPLASTY Bilateral 2007   BREAST ENHANCEMENT SURGERY  2006   Saline implants   CATARACT EXTRACTION Left 10/28/2013   LIPOSUCTION  2006   OTHER SURGICAL HISTORY     CB X3   TUBAL LIGATION  1985    Social History:  reports that she has never smoked. She has never used smokeless tobacco. She reports current alcohol use of about 1.0 standard drink of alcohol per week. She reports that she does not use drugs.  Allergies: Allergies  Allergen Reactions   Bee Venom Swelling   Penicillins     REACTION: hives    Family History:  Family History  Problem Relation Age of Onset   Alcohol abuse Other    Diabetes Other    Hypertension Other    Prostate cancer Other    Colon cancer Neg Hx      Current Outpatient Medications:    albuterol  (VENTOLIN  HFA) 108 (90 Base) MCG/ACT inhaler, INHALE 1-2 PUFFS INTO THE LUNGS EVERY 6 (SIX) HOURS AS NEEDED FOR WHEEZING OR SHORTNESS OF BREATH., Disp: 18 each, Rfl: 2   atorvastatin  (LIPITOR) 20 MG tablet, Take 1 tablet (20 mg total) by mouth daily., Disp: 90 tablet, Rfl: 1   CALCIUM  PO, Take by mouth daily., Disp: , Rfl:    Cholecalciferol (VITAMIN D -3 PO), Take by  mouth daily., Disp: , Rfl:    Cyanocobalamin (VITAMIN B12 PO), Take by mouth daily., Disp: , Rfl:    fluocinonide  cream (LIDEX ) 0.05 %, APPLY ONE APPLICATION TOPICALLY TWO TIMES DAILY AS NEEDED, Disp: 60 g, Rfl: 3   fluticasone  (FLONASE ) 50 MCG/ACT nasal spray, Place 2 sprays into both nostrils daily as needed., Disp: 32 g, Rfl: 11   Hyprom-Naphaz-Polysorb-Zn Sulf (CLEAR EYES COMPLETE OP), Apply to eye., Disp: , Rfl:    levocetirizine (XYZAL ) 5 MG tablet, Take 1 tablet (5 mg total) by mouth every evening., Disp: 90 tablet, Rfl: 1   timolol (TIMOPTIC) 0.5 % ophthalmic solution, 1 drop every morning., Disp: , Rfl:    triamterene -hydrochlorothiazide (MAXZIDE) 75-50 MG tablet, TAKE HALF TABLET BY MOUTH EVERY MORNING, Disp: 45 tablet, Rfl: 1   atorvastatin  (LIPITOR) 40 MG tablet, Take 1 tablet (40 mg total) by mouth daily., Disp: 90 tablet, Rfl: 1   conjugated estrogens  (PREMARIN ) vaginal cream, Place vaginally once a week. (Patient not taking: Reported on 08/24/2024), Disp: 42.5 g, Rfl: 11   Docusate Calcium  (STOOL SOFTENER PO), Take by mouth as needed. (Patient not taking: Reported on 08/24/2024), Disp: , Rfl:    MELATONIN ER PO, Take by mouth as needed. (Patient not taking: Reported on 08/24/2024),  Disp: , Rfl:   Review of Systems:  Negative unless indicated in HPI.   Physical Exam: Vitals:   08/24/24 1408  BP: (!) 142/78  Pulse: 69  Temp: 97.9 F (36.6 C)  TempSrc: Oral  SpO2: 97%  Weight: 150 lb 14.4 oz (68.4 kg)  Height: 5' 5 (1.651 m)    Body mass index is 25.11 kg/m.    Impression and Plan:  Fecal smearing  -Suspect related to rectal fecal ball and seepage of stool.  Have advised she resume Colace and increase fluid consumption.   Time spent:22 minutes reviewing chart, interviewing and examining patient and formulating plan of care.     Tully Theophilus Andrews, MD [facility]

## 2024-08-25 ENCOUNTER — Ambulatory Visit: Admitting: Internal Medicine

## 2024-10-28 ENCOUNTER — Ambulatory Visit: Admitting: Internal Medicine

## 2024-10-28 ENCOUNTER — Encounter: Payer: Self-pay | Admitting: Internal Medicine

## 2024-10-28 VITALS — BP 120/80 | Temp 98.1°F | Ht 65.0 in | Wt 147.4 lb

## 2024-10-28 DIAGNOSIS — E782 Mixed hyperlipidemia: Secondary | ICD-10-CM | POA: Diagnosis not present

## 2024-10-28 DIAGNOSIS — R7989 Other specified abnormal findings of blood chemistry: Secondary | ICD-10-CM

## 2024-10-28 DIAGNOSIS — Z0000 Encounter for general adult medical examination without abnormal findings: Secondary | ICD-10-CM

## 2024-10-28 DIAGNOSIS — E559 Vitamin D deficiency, unspecified: Secondary | ICD-10-CM

## 2024-10-28 DIAGNOSIS — R946 Abnormal results of thyroid function studies: Secondary | ICD-10-CM | POA: Diagnosis not present

## 2024-10-28 DIAGNOSIS — R7302 Impaired glucose tolerance (oral): Secondary | ICD-10-CM

## 2024-10-28 DIAGNOSIS — I10 Essential (primary) hypertension: Secondary | ICD-10-CM | POA: Diagnosis not present

## 2024-10-28 DIAGNOSIS — R32 Unspecified urinary incontinence: Secondary | ICD-10-CM | POA: Diagnosis not present

## 2024-10-28 LAB — HEMOGLOBIN A1C: Hgb A1c MFr Bld: 6.4 % (ref 4.6–6.5)

## 2024-10-28 LAB — LIPID PANEL
Cholesterol: 176 mg/dL (ref 28–200)
HDL: 72.3 mg/dL
LDL Cholesterol: 90 mg/dL (ref 10–99)
NonHDL: 103.76
Total CHOL/HDL Ratio: 2
Triglycerides: 70 mg/dL (ref 10.0–149.0)
VLDL: 14 mg/dL (ref 0.0–40.0)

## 2024-10-28 LAB — COMPREHENSIVE METABOLIC PANEL WITH GFR
ALT: 55 U/L — ABNORMAL HIGH (ref 3–35)
AST: 58 U/L — ABNORMAL HIGH (ref 5–37)
Albumin: 4.5 g/dL (ref 3.5–5.2)
Alkaline Phosphatase: 73 U/L (ref 39–117)
BUN: 18 mg/dL (ref 6–23)
CO2: 34 meq/L — ABNORMAL HIGH (ref 19–32)
Calcium: 9.6 mg/dL (ref 8.4–10.5)
Chloride: 101 meq/L (ref 96–112)
Creatinine, Ser: 0.79 mg/dL (ref 0.40–1.20)
GFR: 70.56 mL/min
Glucose, Bld: 94 mg/dL (ref 70–99)
Potassium: 4.6 meq/L (ref 3.5–5.1)
Sodium: 140 meq/L (ref 135–145)
Total Bilirubin: 0.7 mg/dL (ref 0.2–1.2)
Total Protein: 7.3 g/dL (ref 6.0–8.3)

## 2024-10-28 LAB — CBC WITH DIFFERENTIAL/PLATELET
Basophils Absolute: 0 K/uL (ref 0.0–0.1)
Basophils Relative: 0.8 % (ref 0.0–3.0)
Eosinophils Absolute: 0.3 K/uL (ref 0.0–0.7)
Eosinophils Relative: 6.5 % — ABNORMAL HIGH (ref 0.0–5.0)
HCT: 42 % (ref 36.0–46.0)
Hemoglobin: 13.6 g/dL (ref 12.0–15.0)
Lymphocytes Relative: 36.4 % (ref 12.0–46.0)
Lymphs Abs: 1.6 K/uL (ref 0.7–4.0)
MCHC: 32.4 g/dL (ref 30.0–36.0)
MCV: 89.8 fl (ref 78.0–100.0)
Monocytes Absolute: 0.6 K/uL (ref 0.1–1.0)
Monocytes Relative: 13.1 % — ABNORMAL HIGH (ref 3.0–12.0)
Neutro Abs: 1.9 K/uL (ref 1.4–7.7)
Neutrophils Relative %: 43.2 % (ref 43.0–77.0)
Platelets: 243 K/uL (ref 150.0–400.0)
RBC: 4.67 Mil/uL (ref 3.87–5.11)
RDW: 14 % (ref 11.5–15.5)
WBC: 4.4 K/uL (ref 4.0–10.5)

## 2024-10-28 LAB — TSH: TSH: 4.27 u[IU]/mL (ref 0.35–5.50)

## 2024-10-28 LAB — VITAMIN B12: Vitamin B-12: 884 pg/mL (ref 211–911)

## 2024-10-28 LAB — VITAMIN D 25 HYDROXY (VIT D DEFICIENCY, FRACTURES): VITD: 43.88 ng/mL (ref 30.00–100.00)

## 2024-10-28 MED ORDER — OXYBUTYNIN CHLORIDE 5 MG PO TABS: 5.0000 mg | ORAL_TABLET | Freq: Two times a day (BID) | ORAL | 1 refills | Status: AC

## 2024-10-28 NOTE — Progress Notes (Signed)
 "    Established Patient Office Visit     CC/Reason for Visit: Annual preventive exam  HPI: Wendy Salazar is a 79 y.o. female who is coming in today for the above mentioned reasons. Past Medical History is significant for: Hypertension, hyperlipidemia, impaired glucose tolerance.  She is feeling well without acute concerns or complaints.  All immunizations are up-to-date, she has her mammogram scheduled for later this month.  She elects to no longer pursue colon or cervical cancer screening due to age.  Has routine eye and dental care.  She is wondering about starting oxybutynin  for urinary incontinence/overactive bladder.   Past Medical/Surgical History: Past Medical History:  Diagnosis Date   Allergic eye reaction    Allergic rhinitis    Asthma    Hair loss    HTN (hypertension)    PMS (premenstrual syndrome)    Rectal tenesmus    Urinary incontinence 2005    Past Surgical History:  Procedure Laterality Date   ABDOMINAL SURGERY  2006   Tummy Tuck   AUGMENTATION MAMMAPLASTY Bilateral 2007   BREAST ENHANCEMENT SURGERY  2006   Saline implants   CATARACT EXTRACTION Left 10/28/2013   LIPOSUCTION  2006   OTHER SURGICAL HISTORY     CB X3   TUBAL LIGATION  1985    Social History:  reports that she has never smoked. She has never used smokeless tobacco. She reports current alcohol use of about 1.0 standard drink of alcohol per week. She reports that she does not use drugs.  Allergies: Allergies[1]  Family History:  Family History  Problem Relation Age of Onset   Alcohol abuse Other    Diabetes Other    Hypertension Other    Prostate cancer Other    Colon cancer Neg Hx     Current Medications[2]  Review of Systems:  Negative unless indicated in HPI.   Physical Exam: Vitals:   10/28/24 0850  BP: 120/80  Temp: 98.1 F (36.7 C)  TempSrc: Oral  Weight: 147 lb 6.4 oz (66.9 kg)  Height: 5' 5 (1.651 m)    Body mass index is 24.53 kg/m.   Physical  Exam Vitals reviewed.  Constitutional:      General: She is not in acute distress.    Appearance: Normal appearance. She is not ill-appearing, toxic-appearing or diaphoretic.  HENT:     Head: Normocephalic.     Right Ear: Tympanic membrane, ear canal and external ear normal. There is no impacted cerumen.     Left Ear: Tympanic membrane, ear canal and external ear normal. There is no impacted cerumen.     Nose: Nose normal.     Mouth/Throat:     Mouth: Mucous membranes are moist.     Pharynx: Oropharynx is clear. No oropharyngeal exudate or posterior oropharyngeal erythema.  Eyes:     General: No scleral icterus.       Right eye: No discharge.        Left eye: No discharge.     Conjunctiva/sclera: Conjunctivae normal.     Pupils: Pupils are equal, round, and reactive to light.  Neck:     Vascular: No carotid bruit.  Cardiovascular:     Rate and Rhythm: Normal rate and regular rhythm.     Pulses: Normal pulses.     Heart sounds: Normal heart sounds.  Pulmonary:     Effort: Pulmonary effort is normal. No respiratory distress.     Breath sounds: Normal breath sounds.  Abdominal:  General: Abdomen is flat. Bowel sounds are normal.     Palpations: Abdomen is soft.  Musculoskeletal:        General: Normal range of motion.     Cervical back: Normal range of motion.  Skin:    General: Skin is warm and dry.  Neurological:     General: No focal deficit present.     Mental Status: She is alert and oriented to person, place, and time. Mental status is at baseline.  Psychiatric:        Mood and Affect: Mood normal.        Behavior: Behavior normal.        Thought Content: Thought content normal.        Judgment: Judgment normal.      Impression and Plan:  Encounter for preventive health examination  Essential hypertension -     CBC with Differential/Platelet; Future -     Comprehensive metabolic panel with GFR; Future  Vitamin D  deficiency -     VITAMIN D  25 Hydroxy  (Vit-D Deficiency, Fractures); Future  Abnormal TSH -     TSH; Future -     Vitamin B12; Future  IGT (impaired glucose tolerance) -     Hemoglobin A1c; Future  Mixed hyperlipidemia -     Lipid panel; Future   -Recommend routine eye and dental care. -Healthy lifestyle discussed in detail. -Labs to be updated today. -Prostate cancer screening: Not applicable Health Maintenance  Topic Date Due   COVID-19 Vaccine (8 - 2025-26 season) 02/13/2025   Medicare Annual Wellness Visit  08/18/2025   DTaP/Tdap/Td vaccine (6 - Td or Tdap) 05/17/2029   Pneumococcal Vaccine for age over 12  Completed   Flu Shot  Completed   Osteoporosis screening with Bone Density Scan  Completed   Zoster (Shingles) Vaccine  Completed   Meningitis B Vaccine  Aged Out   Breast Cancer Screening  Discontinued   Colon Cancer Screening  Discontinued   Hepatitis C Screening  Discontinued     -Will try oxybutynin  5 mg twice daily for overactive bladder/urinary incontinence.    Tully Theophilus Andrews, MD [facility]     [1]  Allergies Allergen Reactions   Bee Venom Swelling   Penicillins     REACTION: hives  [2]  Current Outpatient Medications:    albuterol  (VENTOLIN  HFA) 108 (90 Base) MCG/ACT inhaler, INHALE 1-2 PUFFS INTO THE LUNGS EVERY 6 (SIX) HOURS AS NEEDED FOR WHEEZING OR SHORTNESS OF BREATH., Disp: 18 each, Rfl: 2   atorvastatin  (LIPITOR) 40 MG tablet, Take 1 tablet (40 mg total) by mouth daily., Disp: 90 tablet, Rfl: 1   CALCIUM  PO, Take by mouth daily., Disp: , Rfl:    Cholecalciferol (VITAMIN D -3 PO), Take by mouth daily., Disp: , Rfl:    Cyanocobalamin  (VITAMIN B12 PO), Take by mouth daily., Disp: , Rfl:    fluocinonide  cream (LIDEX ) 0.05 %, APPLY ONE APPLICATION TOPICALLY TWO TIMES DAILY AS NEEDED, Disp: 60 g, Rfl: 3   fluticasone  (FLONASE ) 50 MCG/ACT nasal spray, Place 2 sprays into both nostrils daily as needed., Disp: 32 g, Rfl: 11   Hyprom-Naphaz-Polysorb-Zn  Sulf (CLEAR EYES COMPLETE OP), Apply to eye., Disp: , Rfl:    levocetirizine (XYZAL ) 5 MG tablet, Take 1 tablet (5 mg total) by mouth every evening., Disp: 90 tablet, Rfl: 1   timolol (TIMOPTIC) 0.5 % ophthalmic solution, 1 drop every morning., Disp: , Rfl:    triamterene -hydrochlorothiazide (MAXZIDE) 75-50 MG tablet, TAKE  HALF TABLET BY MOUTH EVERY MORNING, Disp: 45 tablet, Rfl: 1  "

## 2024-11-05 ENCOUNTER — Telehealth: Payer: Self-pay | Admitting: *Deleted

## 2024-11-05 NOTE — Telephone Encounter (Signed)
 Spoke to the patient and she is having an asthma flare up.  She would like a prescription for prednisone .  An office visit was scheduled.

## 2024-11-05 NOTE — Telephone Encounter (Signed)
 Copied from CRM [id number]. Topic: General - Other >> Nov 05, 2024  2:15 PM Zebedee SAUNDERS wrote: Reason for CRM: Pt stated needs to only speak with nurse or Dr. Theophilus regarding prednisone . Please contact via MyChart.

## 2024-11-06 ENCOUNTER — Ambulatory Visit: Admitting: Family Medicine

## 2024-11-06 ENCOUNTER — Encounter: Payer: Self-pay | Admitting: Family Medicine

## 2024-11-06 VITALS — BP 110/64 | HR 69 | Temp 98.4°F | Wt 146.4 lb

## 2024-11-06 DIAGNOSIS — J209 Acute bronchitis, unspecified: Secondary | ICD-10-CM

## 2024-11-06 MED ORDER — ALBUTEROL SULFATE HFA 108 (90 BASE) MCG/ACT IN AERS: 1.0000 | INHALATION_SPRAY | Freq: Four times a day (QID) | RESPIRATORY_TRACT | 0 refills | Status: AC | PRN

## 2024-11-06 NOTE — Progress Notes (Signed)
 "  Established Patient Office Visit  Subjective   Patient ID: Wendy Salazar  KALEIYAH POLSKY, female    DOB: [birth year]  Age: 79 y.o. MRN: [id number]  Chief Complaint  Patient presents with   Asthma   Cough    HPI   Ms. Edgington is seen today as a work in with cough for the past few days.  Last Saturday she thinks she may have had some low-grade fever for Saturday and Sunday but none since then.  Cough productive of white sputum.  No hemoptysis.  Had some initial mild bodyaches but improved since then.  She does have history of chronic allergies and takes Flonase  and Xyzal  regularly.  She recently went to use old albuterol  inhaler but this was about 81 years old.  She is requesting refill.  Denies any chronic sinusitis symptoms.  No sore throat.  Denies nausea, vomiting, or diarrhea.  Non-smoker.  No chest pain.  Past Medical History:  Diagnosis Date   Allergic eye reaction    Allergic rhinitis    Asthma    Hair loss    HTN (hypertension)    PMS (premenstrual syndrome)    Rectal tenesmus    Urinary incontinence 2005   Past Surgical History:  Procedure Laterality Date   ABDOMINAL SURGERY  2006   Tummy Tuck   AUGMENTATION MAMMAPLASTY Bilateral 2007   BREAST ENHANCEMENT SURGERY  2006   Saline implants   CATARACT EXTRACTION Left 10/28/2013   LIPOSUCTION  2006   OTHER SURGICAL HISTORY     CB X3   TUBAL LIGATION  1985    reports that she has never smoked. She has never used smokeless tobacco. She reports current alcohol use of about 1.0 standard drink of alcohol per week. She reports that she does not use drugs. family history includes Alcohol abuse in an other family member; Diabetes in an other family member; Hypertension in an other family member; Prostate cancer in an other family member. Allergies[1]  Review of Systems  Constitutional:  Negative for chills and fever.  HENT:  Negative for sinus pain and sore throat.   Respiratory:  Positive for cough. Negative for hemoptysis and shortness  of breath.   Cardiovascular:  Negative for chest pain.      Objective:     BP 110/64   Pulse 69   Temp 98.4 F (36.9 C) (Oral)   Wt 146 lb 6.4 oz (66.4 kg)   SpO2 99%   BMI 24.36 kg/m  BP Readings from Last 3 Encounters:  11/06/24 110/64  10/28/24 120/80  08/24/24 128/78   Wt Readings from Last 3 Encounters:  11/06/24 146 lb 6.4 oz (66.4 kg)  10/28/24 147 lb 6.4 oz (66.9 kg)  08/24/24 150 lb 14.4 oz (68.4 kg)      Physical Exam Vitals reviewed.  Constitutional:      General: She is not in acute distress.    Appearance: She is not ill-appearing.  HENT:     Ears:     Comments: Moderate cerumen both canals obscuring TMs Cardiovascular:     Rate and Rhythm: Normal rate and regular rhythm.  Pulmonary:     Effort: Pulmonary effort is normal.     Breath sounds: Normal breath sounds. No wheezing or rales.  Neurological:     Mental Status: She is alert.      No results found for any visits on 11/06/24.    The ASCVD Risk score (Arnett DK, et al., 2019) failed to calculate for the following  reasons:   The 2019 ASCVD risk score is only valid for ages 87 to 34   * - Cholesterol units were assumed    Assessment & Plan:   Cough.  Suspect acute viral bronchitis.  Does not have any red flags such as hypoxia, fever, or any focal abnormalities on exam.  Refill albuterol  for as needed use.  Follow-up promptly for any fever or increased shortness of breath.  Wolm Scarlet, MD     [1]  Allergies Allergen Reactions   Bee Venom Swelling   Penicillins     REACTION: hives   "

## 2024-11-08 ENCOUNTER — Other Ambulatory Visit: Payer: Self-pay | Admitting: Internal Medicine

## 2024-11-08 DIAGNOSIS — J302 Other seasonal allergic rhinitis: Secondary | ICD-10-CM

## 2024-11-12 ENCOUNTER — Ambulatory Visit: Payer: Self-pay | Admitting: Internal Medicine

## 2024-11-12 DIAGNOSIS — R7989 Other specified abnormal findings of blood chemistry: Secondary | ICD-10-CM

## 2024-11-12 DIAGNOSIS — R7302 Impaired glucose tolerance (oral): Secondary | ICD-10-CM

## 2025-01-04 ENCOUNTER — Other Ambulatory Visit

## 2025-02-15 ENCOUNTER — Ambulatory Visit: Admitting: Internal Medicine
